# Patient Record
Sex: Female | Born: 1960
Health system: Southern US, Community
[De-identification: ages and names within clinical notes are randomized; demographics above are authoritative.]

## PROBLEM LIST (undated history)

## (undated) DIAGNOSIS — I8393 Asymptomatic varicose veins of bilateral lower extremities: Secondary | ICD-10-CM

## (undated) DIAGNOSIS — E079 Disorder of thyroid, unspecified: Secondary | ICD-10-CM

## (undated) DIAGNOSIS — I10 Essential (primary) hypertension: Secondary | ICD-10-CM

## (undated) DIAGNOSIS — I341 Nonrheumatic mitral (valve) prolapse: Secondary | ICD-10-CM

## (undated) DIAGNOSIS — K219 Gastro-esophageal reflux disease without esophagitis: Secondary | ICD-10-CM

## (undated) DIAGNOSIS — E785 Hyperlipidemia, unspecified: Secondary | ICD-10-CM

## (undated) DIAGNOSIS — Z85828 Personal history of other malignant neoplasm of skin: Secondary | ICD-10-CM

## (undated) DIAGNOSIS — E0580 Other thyrotoxicosis without thyrotoxic crisis or storm: Secondary | ICD-10-CM

## (undated) DIAGNOSIS — C801 Malignant (primary) neoplasm, unspecified: Secondary | ICD-10-CM

## (undated) DIAGNOSIS — R1310 Dysphagia, unspecified: Secondary | ICD-10-CM

## (undated) DIAGNOSIS — E063 Autoimmune thyroiditis: Secondary | ICD-10-CM

## (undated) DIAGNOSIS — L409 Psoriasis, unspecified: Secondary | ICD-10-CM

## (undated) DIAGNOSIS — I471 Supraventricular tachycardia, unspecified: Secondary | ICD-10-CM

## (undated) DIAGNOSIS — I499 Cardiac arrhythmia, unspecified: Secondary | ICD-10-CM

## (undated) DIAGNOSIS — I871 Compression of vein: Secondary | ICD-10-CM

## (undated) DIAGNOSIS — G43909 Migraine, unspecified, not intractable, without status migrainosus: Secondary | ICD-10-CM

## (undated) DIAGNOSIS — E039 Hypothyroidism, unspecified: Secondary | ICD-10-CM

## (undated) DIAGNOSIS — G43C0 Periodic headache syndromes in child or adult, not intractable: Secondary | ICD-10-CM

## (undated) DIAGNOSIS — R1319 Other dysphagia: Secondary | ICD-10-CM

## (undated) HISTORY — DX: Other dysphagia: R13.19

## (undated) HISTORY — PX: REFRACTIVE SURGERY: SHX103

## (undated) HISTORY — DX: Personal history of other malignant neoplasm of skin: Z85.828

## (undated) HISTORY — DX: Periodic headache syndromes in child or adult, not intractable: G43.C0

## (undated) HISTORY — PX: IR SCLEROTHERAPY OF A FLUID COLLECTION: IMG6090

## (undated) HISTORY — DX: Disorder of thyroid, unspecified: E07.9

## (undated) HISTORY — DX: Other thyrotoxicosis without thyrotoxic crisis or storm: E05.80

## (undated) HISTORY — DX: Psoriasis, unspecified: L40.9

## (undated) HISTORY — DX: Cardiac arrhythmia, unspecified: I49.9

## (undated) HISTORY — DX: Autoimmune thyroiditis: E06.3

## (undated) HISTORY — DX: Asymptomatic varicose veins of bilateral lower extremities: I83.93

## (undated) HISTORY — DX: Migraine, unspecified, not intractable, without status migrainosus: G43.909

## (undated) HISTORY — DX: Essential (primary) hypertension: I10

## (undated) HISTORY — DX: Nonrheumatic mitral (valve) prolapse: I34.1

---

## 1997-10-06 HISTORY — PX: TOTAL ABDOMINAL HYSTERECTOMY: SHX209

## 2000-10-06 HISTORY — PX: MELANOMA EXCISION: SHX5266

## 2004-09-25 ENCOUNTER — Ambulatory Visit: Payer: Self-pay | Admitting: Internal Medicine

## 2005-02-26 ENCOUNTER — Ambulatory Visit: Payer: Self-pay | Admitting: Otolaryngology

## 2006-07-30 ENCOUNTER — Ambulatory Visit: Payer: Self-pay | Admitting: Obstetrics & Gynecology

## 2007-12-30 ENCOUNTER — Ambulatory Visit: Payer: Self-pay | Admitting: Obstetrics and Gynecology

## 2009-02-09 ENCOUNTER — Ambulatory Visit: Payer: Self-pay | Admitting: Internal Medicine

## 2009-05-16 ENCOUNTER — Ambulatory Visit: Payer: Self-pay | Admitting: Obstetrics and Gynecology

## 2010-05-31 ENCOUNTER — Other Ambulatory Visit: Payer: Self-pay | Admitting: Internal Medicine

## 2010-06-21 ENCOUNTER — Ambulatory Visit: Payer: Self-pay | Admitting: Unknown Physician Specialty

## 2010-12-09 ENCOUNTER — Ambulatory Visit: Payer: Self-pay

## 2011-11-18 ENCOUNTER — Other Ambulatory Visit: Payer: Self-pay | Admitting: Internal Medicine

## 2011-11-18 LAB — TSH: Thyroid Stimulating Horm: 0.456 u[IU]/mL

## 2011-11-18 LAB — LIPASE, BLOOD: Lipase: 101 U/L (ref 73–393)

## 2012-08-02 ENCOUNTER — Other Ambulatory Visit: Payer: Self-pay | Admitting: Internal Medicine

## 2012-08-02 LAB — URINALYSIS, COMPLETE
Bilirubin,UR: NEGATIVE
Blood: NEGATIVE
Ketone: NEGATIVE
Ph: 8 (ref 4.5–8.0)
Specific Gravity: 1.001 (ref 1.003–1.030)
Squamous Epithelial: NONE SEEN

## 2012-08-02 LAB — BASIC METABOLIC PANEL
BUN: 12 mg/dL (ref 7–18)
Calcium, Total: 8.9 mg/dL (ref 8.5–10.1)
Chloride: 102 mmol/L (ref 98–107)
Creatinine: 0.92 mg/dL (ref 0.60–1.30)
EGFR (Non-African Amer.): >60
Glucose: 84 mg/dL (ref 65–99)
Osmolality: 280 (ref 275–301)
Potassium: 4.1 mmol/L (ref 3.5–5.1)

## 2012-08-02 LAB — LIPID PANEL
Cholesterol: 215 mg/dL — ABNORMAL HIGH (ref 0–200)
HDL Cholesterol: 80 mg/dL — ABNORMAL HIGH (ref 40–60)
Ldl Cholesterol, Calc: 123 mg/dL — ABNORMAL HIGH (ref 0–100)
Triglycerides: 61 mg/dL (ref 0–200)
VLDL Cholesterol, Calc: 12 mg/dL (ref 5–40)

## 2012-09-22 ENCOUNTER — Other Ambulatory Visit: Payer: Self-pay | Admitting: Internal Medicine

## 2013-02-09 ENCOUNTER — Other Ambulatory Visit: Payer: Self-pay | Admitting: Internal Medicine

## 2013-02-09 LAB — LIPID PANEL
Cholesterol: 224 mg/dL — ABNORMAL HIGH (ref 0–200)
HDL Cholesterol: 89 mg/dL — ABNORMAL HIGH (ref 40–60)
Ldl Cholesterol, Calc: 126 mg/dL — ABNORMAL HIGH (ref 0–100)
Triglycerides: 43 mg/dL (ref 0–200)
VLDL Cholesterol, Calc: 9 mg/dL (ref 5–40)

## 2013-02-09 LAB — CBC WITH DIFFERENTIAL/PLATELET
Basophil #: 0.1 10*3/uL (ref 0.0–0.1)
Basophil %: 1 %
Eosinophil #: 0.5 10*3/uL (ref 0.0–0.7)
Eosinophil %: 9.3 %
HCT: 37.6 % (ref 35.0–47.0)
HGB: 12.6 g/dL (ref 12.0–16.0)
Lymphocyte #: 1.4 10*3/uL (ref 1.0–3.6)
Lymphocyte %: 26.4 %
MCH: 29.3 pg (ref 26.0–34.0)
MCHC: 33.5 g/dL (ref 32.0–36.0)
MCV: 87 fL (ref 80–100)
Monocyte #: 0.5 x10 3/mm (ref 0.2–0.9)
Neutrophil #: 3 10*3/uL (ref 1.4–6.5)
Neutrophil %: 55 %
Platelet: 224 10*3/uL (ref 150–440)
RBC: 4.31 10*6/uL (ref 3.80–5.20)
WBC: 5.5 10*3/uL (ref 3.6–11.0)

## 2013-02-09 LAB — BASIC METABOLIC PANEL
Anion Gap: 5 — ABNORMAL LOW (ref 7–16)
Co2: 31 mmol/L (ref 21–32)
Creatinine: 0.9 mg/dL (ref 0.60–1.30)
EGFR (African American): >60
Osmolality: 273 (ref 275–301)
Potassium: 3.7 mmol/L (ref 3.5–5.1)
Sodium: 136 mmol/L (ref 136–145)

## 2013-02-09 LAB — URINALYSIS, COMPLETE
Ketone: NEGATIVE
Ph: 7 (ref 4.5–8.0)
Protein: NEGATIVE
RBC,UR: 4 /HPF (ref 0–5)
Specific Gravity: 1.012 (ref 1.003–1.030)
WBC UR: 14 /HPF (ref 0–5)

## 2013-09-23 ENCOUNTER — Ambulatory Visit: Payer: Self-pay | Admitting: Unknown Physician Specialty

## 2013-09-26 LAB — PATHOLOGY REPORT

## 2014-01-24 ENCOUNTER — Ambulatory Visit: Payer: Self-pay

## 2014-02-24 ENCOUNTER — Ambulatory Visit: Payer: Self-pay | Admitting: Unknown Physician Specialty

## 2015-01-12 ENCOUNTER — Encounter: Payer: Self-pay | Admitting: *Deleted

## 2015-02-16 ENCOUNTER — Other Ambulatory Visit
Admission: RE | Admit: 2015-02-16 | Discharge: 2015-02-16 | Disposition: A | Discharge status: Home / Self Care | Payer: 59 | Source: Ambulatory Visit | Attending: General Practice | Admitting: General Practice

## 2015-02-16 DIAGNOSIS — Z048 Encounter for examination and observation for other specified reasons: Secondary | ICD-10-CM | POA: Insufficient documentation

## 2015-02-16 DIAGNOSIS — Z779 Other contact with and (suspected) exposures hazardous to health: Secondary | ICD-10-CM | POA: Insufficient documentation

## 2015-02-16 LAB — POTASSIUM: Potassium: 3.8 mmol/L (ref 3.5–5.1)

## 2015-02-16 LAB — TSH: TSH: 3.822 u[IU]/mL (ref 0.350–4.500)

## 2015-02-16 LAB — CREATININE, SERUM: Creatinine, Ser: 0.91 mg/dL (ref 0.44–1.00)

## 2015-02-16 LAB — BUN: BUN: 18 mg/dL (ref 6–20)

## 2015-02-16 LAB — ALT: ALT: 24 U/L (ref 14–54)

## 2015-09-10 DIAGNOSIS — E785 Hyperlipidemia, unspecified: Secondary | ICD-10-CM | POA: Insufficient documentation

## 2015-09-10 DIAGNOSIS — E063 Autoimmune thyroiditis: Secondary | ICD-10-CM | POA: Insufficient documentation

## 2015-09-10 DIAGNOSIS — L409 Psoriasis, unspecified: Secondary | ICD-10-CM | POA: Insufficient documentation

## 2015-09-10 DIAGNOSIS — E038 Other specified hypothyroidism: Secondary | ICD-10-CM | POA: Insufficient documentation

## 2015-09-10 DIAGNOSIS — I10 Essential (primary) hypertension: Secondary | ICD-10-CM | POA: Insufficient documentation

## 2015-11-29 DIAGNOSIS — D225 Melanocytic nevi of trunk: Secondary | ICD-10-CM | POA: Diagnosis not present

## 2015-11-29 DIAGNOSIS — D2262 Melanocytic nevi of left upper limb, including shoulder: Secondary | ICD-10-CM | POA: Diagnosis not present

## 2015-11-29 DIAGNOSIS — Z85820 Personal history of malignant melanoma of skin: Secondary | ICD-10-CM | POA: Diagnosis not present

## 2015-11-29 DIAGNOSIS — D2271 Melanocytic nevi of right lower limb, including hip: Secondary | ICD-10-CM | POA: Diagnosis not present

## 2015-12-12 DIAGNOSIS — H5213 Myopia, bilateral: Secondary | ICD-10-CM | POA: Diagnosis not present

## 2016-05-02 DIAGNOSIS — H5213 Myopia, bilateral: Secondary | ICD-10-CM | POA: Diagnosis not present

## 2016-05-02 DIAGNOSIS — H1852 Epithelial (juvenile) corneal dystrophy: Secondary | ICD-10-CM | POA: Diagnosis not present

## 2016-06-30 DIAGNOSIS — Z08 Encounter for follow-up examination after completed treatment for malignant neoplasm: Secondary | ICD-10-CM | POA: Diagnosis not present

## 2016-06-30 DIAGNOSIS — Z85820 Personal history of malignant melanoma of skin: Secondary | ICD-10-CM | POA: Diagnosis not present

## 2016-06-30 DIAGNOSIS — D225 Melanocytic nevi of trunk: Secondary | ICD-10-CM | POA: Diagnosis not present

## 2016-10-24 ENCOUNTER — Ambulatory Visit (INDEPENDENT_AMBULATORY_CARE_PROVIDER_SITE_OTHER): Payer: 59 | Admitting: Physician Assistant

## 2016-10-24 ENCOUNTER — Encounter: Payer: Self-pay | Admitting: Physician Assistant

## 2016-10-24 VITALS — BP 135/85 | HR 58 | Resp 18 | Ht 68.0 in | Wt 142.0 lb

## 2016-10-24 DIAGNOSIS — G43009 Migraine without aura, not intractable, without status migrainosus: Secondary | ICD-10-CM

## 2016-10-24 DIAGNOSIS — M62838 Other muscle spasm: Secondary | ICD-10-CM

## 2016-10-24 DIAGNOSIS — I341 Nonrheumatic mitral (valve) prolapse: Secondary | ICD-10-CM | POA: Insufficient documentation

## 2016-10-24 DIAGNOSIS — Z85828 Personal history of other malignant neoplasm of skin: Secondary | ICD-10-CM | POA: Insufficient documentation

## 2016-10-24 DIAGNOSIS — G43709 Chronic migraine without aura, not intractable, without status migrainosus: Secondary | ICD-10-CM | POA: Diagnosis not present

## 2016-10-24 MED ORDER — PROMETHAZINE HCL 25 MG PO TABS: 25.0000 mg | ORAL_TABLET | Freq: Four times a day (QID) | ORAL | 0 refills | Status: AC | PRN

## 2016-10-24 MED ORDER — CYCLOBENZAPRINE HCL 10 MG PO TABS: 10.0000 mg | ORAL_TABLET | Freq: Three times a day (TID) | ORAL | 1 refills | Status: DC | PRN

## 2016-10-24 NOTE — Progress Notes (Signed)
History:  Sara Moss is a 56 y.o. G1P1001 who presents to clinic today for a new headache visit.   Her chiropractor told her she may benefit from Botox.  She has had migraines since age 32.  Initially they were 2x per year.  Now she is having them much more often.  She has almost daily HA but the do not all turn into migraines.  They can be severe, located at the base of the head and the right eye.  There is throbbing, movement makes it worse.  There is nausea, sensitivity to light and noise.  There is nausea and vomiting.  There is no aura.  It often wakes her up during the night.  It lasts several days.   She uses Axert for acute migraine.  She gets that from Dr. Kandace Blitz.  She uses BC Powders.  Advil and Aleve have not been useful.  She has not used any other triptan.  She has not used prevention for migraine but is on lisinopril.   She has episodes of palpitations or feeling her heart rate increasing and plans to seek cardiology referral again as it has been years since last seen for this.    HIT6:66 Number of days in the last 4 weeks with:  Severe headache: 6 Moderate headache: 14 Mild headache: 5  No headache: 3   Past Medical History:  Diagnosis Date  . Hypertension   . Migraines   . Mitral valve prolapse   . Psoriasis   . Thyroid disease    Hypothyroid    Social History   Social History  . Marital status: Married    Spouse name: N/A  . Number of children: N/A  . Years of education: N/A   Occupational History  . Not on file.   Social History Main Topics  . Smoking status: Never Smoker  . Smokeless tobacco: Never Used  . Alcohol use Yes     Comment: rare  . Drug use: No  . Sexual activity: Yes    Birth control/ protection: Surgical   Other Topics Concern  . Not on file   Social History Narrative  . No narrative on file    Family History  Problem Relation Age of Onset  . Cancer Father     esophageal  . Cancer Mother     cervical    No Known  Allergies  No current outpatient prescriptions on file prior to visit.   No current facility-administered medications on file prior to visit.      Review of Systems:  All pertinent positive/negative included in HPI, all other review of systems are negative   Objective:  Physical Exam BP 135/85 (BP Location: Left Arm, Patient Position: Sitting, Cuff Size: Normal)   Pulse (!) 58   Resp 18   Ht 5\' 8"  (1.727 m)   Wt 142 lb (64.4 kg)   BMI 21.59 kg/m  CONSTITUTIONAL: Well-developed, well-nourished female in no acute distress.  EYES: EOM intact ENT: Normocephalic CARDIOVASCULAR: Regular rate RESPIRATORY: Normal rate.  MUSCULOSKELETAL: Normal ROM, strength equal bilaterally, bilat muscle spasm noted SKIN: Warm, dry without erythema  NEUROLOGICAL: Alert, oriented, CN II-XII grossly intact, Appropriate balance PSYCH: Normal behavior, mood   Assessment & Plan:  Assessment: 1. Muscle spasm   2. Migraine without aura and without status migrainosus, not intractable   3. Chronic migraine without aura without status migrainosus, not intractable   4. MVP (mitral valve prolapse)      Plan: Patient interested in  potential migraine prevention drug study.  Will inquire on her behalf.   She is also interested in Botox.  This is a lovely option as it will not interfere with other medications however it may be too costly.  I will inquire about a prior authorization for her.  After determining eligibility and cost, she will be better equipped to decide.   She has not used preventive medications previously.  Could consider TCA or Topiramate.   See cardiology for palpitations.   Continue Axert for now.   Flexeril for muscle spasm Phenergan for vomiting/HA rescue. Follow-up in 6 weeks or sooner PRN  Paticia Stack, PA-C 10/24/2016 10:11 AM

## 2016-10-24 NOTE — Patient Instructions (Signed)
Migraine Headache A migraine headache is an intense, throbbing pain on one side or both sides of the head. Migraines may also cause other symptoms, such as nausea, vomiting, and sensitivity to light and noise. What are the causes? Doing or taking certain things may also trigger migraines, such as:  Alcohol.  Smoking.  Medicines, such as: ? Medicine used to treat chest pain (nitroglycerine). ? Birth control pills. ? Estrogen pills. ? Certain blood pressure medicines.  Aged cheeses, chocolate, or caffeine.  Foods or drinks that contain nitrates, glutamate, aspartame, or tyramine.  Physical activity.  Other things that may trigger a migraine include:  Menstruation.  Pregnancy.  Hunger.  Stress, lack of sleep, too much sleep, or fatigue.  Weather changes.  What increases the risk? The following factors may make you more likely to experience migraine headaches:  Age. Risk increases with age.  Family history of migraine headaches.  Being Caucasian.  Depression and anxiety.  Obesity.  Being a woman.  Having a hole in the heart (patent foramen ovale) or other heart problems.  What are the signs or symptoms? The main symptom of this condition is pulsating or throbbing pain. Pain may:  Happen in any area of the head, such as on one side or both sides.  Interfere with daily activities.  Get worse with physical activity.  Get worse with exposure to bright lights or loud noises.  Other symptoms may include:  Nausea.  Vomiting.  Dizziness.  General sensitivity to bright lights, loud noises, or smells.  Before you get a migraine, you may get warning signs that a migraine is developing (aura). An aura may include:  Seeing flashing lights or having blind spots.  Seeing bright spots, halos, or zigzag lines.  Having tunnel vision or blurred vision.  Having numbness or a tingling feeling.  Having trouble talking.  Having muscle weakness.  How is this  diagnosed? A migraine headache can be diagnosed based on:  Your symptoms.  A physical exam.  Tests, such as CT scan or MRI of the head. These imaging tests can help rule out other causes of headaches.  Taking fluid from the spine (lumbar puncture) and analyzing it (cerebrospinal fluid analysis, or CSF analysis).  How is this treated? A migraine headache is usually treated with medicines that:  Relieve pain.  Relieve nausea.  Prevent migraines from coming back.  Treatment may also include:  Acupuncture.  Lifestyle changes like avoiding foods that trigger migraines.  Follow these instructions at home: Medicines  Take over-the-counter and prescription medicines only as told by your health care provider.  Do not drive or use heavy machinery while taking prescription pain medicine.  To prevent or treat constipation while you are taking prescription pain medicine, your health care provider may recommend that you: ? Drink enough fluid to keep your urine clear or pale yellow. ? Take over-the-counter or prescription medicines. ? Eat foods that are high in fiber, such as fresh fruits and vegetables, whole grains, and beans. ? Limit foods that are high in fat and processed sugars, such as fried and sweet foods. Lifestyle  Avoid alcohol use.  Do not use any products that contain nicotine or tobacco, such as cigarettes and e-cigarettes. If you need help quitting, ask your health care provider.  Get at least 8 hours of sleep every night.  Limit your stress. General instructions   Keep a journal to find out what may trigger your migraine headaches. For example, write down: ? What you eat and   drink. ? How much sleep you get. ? Any change to your diet or medicines.  If you have a migraine: ? Avoid things that make your symptoms worse, such as bright lights. ? It may help to lie down in a dark, quiet room. ? Do not drive or use heavy machinery. ? Ask your health care provider  what activities are safe for you while you are experiencing symptoms.  Keep all follow-up visits as told by your health care provider. This is important. Contact a health care provider if:  You develop symptoms that are different or more severe than your usual migraine symptoms. Get help right away if:  Your migraine becomes severe.  You have a fever.  You have a stiff neck.  You have vision loss.  Your muscles feel weak or like you cannot control them.  You start to lose your balance often.  You develop trouble walking.  You faint. This information is not intended to replace advice given to you by your health care provider. Make sure you discuss any questions you have with your health care provider. Document Released: 09/22/2005 Document Revised: 04/11/2016 Document Reviewed: 03/10/2016 Elsevier Interactive Patient Education  2017 [employer]   

## 2016-11-12 ENCOUNTER — Other Ambulatory Visit: Payer: Self-pay | Admitting: Internal Medicine

## 2016-11-12 DIAGNOSIS — L409 Psoriasis, unspecified: Secondary | ICD-10-CM | POA: Diagnosis not present

## 2016-11-12 DIAGNOSIS — E038 Other specified hypothyroidism: Secondary | ICD-10-CM | POA: Diagnosis not present

## 2016-11-12 DIAGNOSIS — Z1159 Encounter for screening for other viral diseases: Secondary | ICD-10-CM | POA: Diagnosis not present

## 2016-11-12 DIAGNOSIS — Z0001 Encounter for general adult medical examination with abnormal findings: Secondary | ICD-10-CM | POA: Diagnosis not present

## 2016-11-12 DIAGNOSIS — I471 Supraventricular tachycardia: Secondary | ICD-10-CM | POA: Diagnosis not present

## 2016-11-12 DIAGNOSIS — I10 Essential (primary) hypertension: Secondary | ICD-10-CM | POA: Diagnosis not present

## 2016-11-12 DIAGNOSIS — Z1231 Encounter for screening mammogram for malignant neoplasm of breast: Secondary | ICD-10-CM

## 2016-11-19 ENCOUNTER — Other Ambulatory Visit: Payer: Self-pay | Admitting: Internal Medicine

## 2016-11-19 DIAGNOSIS — R011 Cardiac murmur, unspecified: Secondary | ICD-10-CM

## 2016-11-24 ENCOUNTER — Ambulatory Visit: Payer: 59

## 2016-11-25 ENCOUNTER — Ambulatory Visit
Admission: RE | Admit: 2016-11-25 | Discharge: 2016-11-25 | Disposition: A | Discharge status: Home / Self Care | Payer: 59 | Source: Ambulatory Visit | Attending: Internal Medicine | Admitting: Internal Medicine

## 2016-11-25 DIAGNOSIS — G43909 Migraine, unspecified, not intractable, without status migrainosus: Secondary | ICD-10-CM | POA: Diagnosis not present

## 2016-11-25 DIAGNOSIS — I340 Nonrheumatic mitral (valve) insufficiency: Secondary | ICD-10-CM | POA: Diagnosis not present

## 2016-11-25 DIAGNOSIS — I341 Nonrheumatic mitral (valve) prolapse: Secondary | ICD-10-CM | POA: Diagnosis not present

## 2016-11-25 DIAGNOSIS — R011 Cardiac murmur, unspecified: Secondary | ICD-10-CM | POA: Diagnosis not present

## 2016-11-25 DIAGNOSIS — E079 Disorder of thyroid, unspecified: Secondary | ICD-10-CM | POA: Diagnosis not present

## 2016-11-25 DIAGNOSIS — I10 Essential (primary) hypertension: Secondary | ICD-10-CM | POA: Insufficient documentation

## 2016-11-25 NOTE — Progress Notes (Signed)
*  PRELIMINARY RESULTS* Echocardiogram 2D Echocardiogram has been performed.  Sara Moss 11/25/2016, 10:51 AM

## 2016-12-05 ENCOUNTER — Encounter: Payer: Self-pay | Admitting: Physician Assistant

## 2016-12-05 ENCOUNTER — Ambulatory Visit (INDEPENDENT_AMBULATORY_CARE_PROVIDER_SITE_OTHER): Payer: 59 | Admitting: Physician Assistant

## 2016-12-05 VITALS — BP 137/73 | HR 78 | Resp 18 | Ht 68.0 in | Wt 143.0 lb

## 2016-12-05 DIAGNOSIS — G43709 Chronic migraine without aura, not intractable, without status migrainosus: Secondary | ICD-10-CM | POA: Diagnosis not present

## 2016-12-05 DIAGNOSIS — G43009 Migraine without aura, not intractable, without status migrainosus: Secondary | ICD-10-CM

## 2016-12-05 DIAGNOSIS — M62838 Other muscle spasm: Secondary | ICD-10-CM | POA: Insufficient documentation

## 2016-12-05 DIAGNOSIS — IMO0002 Reserved for concepts with insufficient information to code with codable children: Secondary | ICD-10-CM | POA: Insufficient documentation

## 2016-12-05 MED ORDER — SUMATRIPTAN SUCCINATE 100 MG PO TABS: 100.0000 mg | ORAL_TABLET | Freq: Once | ORAL | 2 refills | Status: AC | PRN

## 2016-12-05 MED ORDER — CYCLOBENZAPRINE HCL 10 MG PO TABS: 10.0000 mg | ORAL_TABLET | Freq: Three times a day (TID) | ORAL | 1 refills | Status: DC | PRN

## 2016-12-05 NOTE — Patient Instructions (Signed)

## 2016-12-05 NOTE — Progress Notes (Signed)
History:  Sara Moss is a 56 y.o. G1P1001 who presents to clinic today for HA f/u.  She is now taking flexeril 5mg  most every night and it has been helpful.  She is waking with fewer migraines.  The Axert has continued to work but is costing $50 for 9 tablets - generic.  She would like to consider another option. She had an ECHO and no problems were identified.  She confirmed that she can se a triptan with her MVP.  She is having fewer palpitations but also has a new rx for metoprolol to use PRN.  She has not yet tried this.   She is advised that beta blockers can provide good migraine prevention.  She is not interested in using any daily medications for migraine prevention at this itme.    HIT6:54 Number of days in the last 4 weeks with:  Severe headache: 3 Moderate headache: 12 Mild headache: 7  No headache: 6   Past Medical History:  Diagnosis Date  . Hypertension   . Migraines   . Mitral valve prolapse   . Psoriasis   . Thyroid disease    Hypothyroid    Social History   Social History  . Marital status: Married    Spouse name: N/A  . Number of children: N/A  . Years of education: N/A   Occupational History  . Not on file.   Social History Main Topics  . Smoking status: Never Smoker  . Smokeless tobacco: Never Used  . Alcohol use Yes     Comment: rare  . Drug use: No  . Sexual activity: Yes    Birth control/ protection: Surgical   Other Topics Concern  . Not on file   Social History Narrative  . No narrative on file    Family History  Problem Relation Age of Onset  . Cancer Father     esophageal  . Cancer Mother     cervical    No Known Allergies  Current Outpatient Prescriptions on File Prior to Visit  Medication Sig Dispense Refill  . almotriptan (AXERT) 12.5 MG tablet Take 12.5 mg by mouth as needed.    . ALPRAZolam (XANAX) 0.25 MG tablet Take 0.25 mg by mouth as needed.    . cyclobenzaprine (FLEXERIL) 10 MG tablet Take 1 tablet (10 mg total) by  mouth every 8 (eight) hours as needed for muscle spasms. 30 tablet 1  . lisinopril (PRINIVIL,ZESTRIL) 10 MG tablet Take 10 mg by mouth daily.  3  . promethazine (PHENERGAN) 25 MG tablet Take 1 tablet (25 mg total) by mouth every 6 (six) hours as needed for nausea or vomiting. 30 tablet 0  . SYNTHROID 100 MCG tablet Take 100 mcg by mouth daily.  3   No current facility-administered medications on file prior to visit.      Review of Systems:  All pertinent positive/negative included in HPI, all other review of systems are negative   Objective:  Physical Exam BP 137/73 (BP Location: Left Arm, Patient Position: Sitting, Cuff Size: Normal)   Pulse 78   Resp 18   Ht 5\' 8"  (1.727 m)   Wt 143 lb (64.9 kg)   BMI 21.74 kg/m  CONSTITUTIONAL: Well-developed, well-nourished female in no acute distress.  EYES: EOM intact ENT: Normocephalic CARDIOVASCULAR: Regular rate RESPIRATORY: Normal rate.   MUSCULOSKELETAL: Normal ROM SKIN: Warm, dry without erythema  NEUROLOGICAL: Alert, oriented, CN II-XII grossly intact, Appropriate balance PSYCH: Normal behavior, mood   Assessment &  Plan:  Assessment: 1. Migraine without aura and without status migrainosus, not intractable   2. Muscle spasm   3. Chronic migraine      Plan: Pt declines prevention with beta blocker/topamax/etc.   Continue flexeril as previous Replace Axert with Imitrex.  She may use 1/2 tab if desired.   Follow-up in 3 months or sooner PRN Will submit for botox prior auth as this has not yet been done and pt may be interested.  Paticia Stack, PA-C 12/05/2016 9:46 AM

## 2016-12-10 ENCOUNTER — Ambulatory Visit: Payer: 59

## 2016-12-10 DIAGNOSIS — E038 Other specified hypothyroidism: Secondary | ICD-10-CM | POA: Diagnosis not present

## 2016-12-10 DIAGNOSIS — I10 Essential (primary) hypertension: Secondary | ICD-10-CM | POA: Diagnosis not present

## 2016-12-10 DIAGNOSIS — E784 Other hyperlipidemia: Secondary | ICD-10-CM | POA: Diagnosis not present

## 2016-12-10 DIAGNOSIS — I471 Supraventricular tachycardia: Secondary | ICD-10-CM | POA: Diagnosis not present

## 2017-02-02 DIAGNOSIS — Z85820 Personal history of malignant melanoma of skin: Secondary | ICD-10-CM | POA: Diagnosis not present

## 2017-02-02 DIAGNOSIS — D2272 Melanocytic nevi of left lower limb, including hip: Secondary | ICD-10-CM | POA: Diagnosis not present

## 2017-02-02 DIAGNOSIS — D225 Melanocytic nevi of trunk: Secondary | ICD-10-CM | POA: Diagnosis not present

## 2017-02-02 DIAGNOSIS — D2261 Melanocytic nevi of right upper limb, including shoulder: Secondary | ICD-10-CM | POA: Diagnosis not present

## 2017-03-11 ENCOUNTER — Encounter: Payer: Self-pay | Admitting: Obstetrics and Gynecology

## 2017-03-11 ENCOUNTER — Ambulatory Visit (INDEPENDENT_AMBULATORY_CARE_PROVIDER_SITE_OTHER): Payer: 59 | Admitting: Obstetrics and Gynecology

## 2017-03-11 VITALS — BP 128/87 | HR 61 | Ht 68.0 in | Wt 143.9 lb

## 2017-03-11 DIAGNOSIS — N952 Postmenopausal atrophic vaginitis: Secondary | ICD-10-CM

## 2017-03-11 DIAGNOSIS — N941 Unspecified dyspareunia: Secondary | ICD-10-CM | POA: Diagnosis not present

## 2017-03-11 DIAGNOSIS — Z1231 Encounter for screening mammogram for malignant neoplasm of breast: Secondary | ICD-10-CM | POA: Diagnosis not present

## 2017-03-11 DIAGNOSIS — Z90722 Acquired absence of ovaries, bilateral: Secondary | ICD-10-CM | POA: Diagnosis not present

## 2017-03-11 DIAGNOSIS — Z9079 Acquired absence of other genital organ(s): Secondary | ICD-10-CM

## 2017-03-11 DIAGNOSIS — Z1239 Encounter for other screening for malignant neoplasm of breast: Secondary | ICD-10-CM

## 2017-03-11 DIAGNOSIS — Z01419 Encounter for gynecological examination (general) (routine) without abnormal findings: Secondary | ICD-10-CM | POA: Diagnosis not present

## 2017-03-11 DIAGNOSIS — Z90710 Acquired absence of both cervix and uterus: Secondary | ICD-10-CM

## 2017-03-11 DIAGNOSIS — Z1211 Encounter for screening for malignant neoplasm of colon: Secondary | ICD-10-CM

## 2017-03-11 MED ORDER — PRASTERONE 6.5 MG VA INST: 6.5000 mg | VAGINAL_INSERT | Freq: Every day | VAGINAL | 6 refills | Status: DC

## 2017-03-11 NOTE — Progress Notes (Signed)
ANNUAL PREVENTATIVE CARE GYN  ENCOUNTER NOTE  Subjective:       Sara Moss is a 56 y.o. G3P1001 female here for a routine annual gynecologic exam.  Current complaints: 1.  Painful IC   No major interval health issues. Ongoing problem of painful intercourse persists. Over-the-counter suppositories/lubricants are not very helpful.   Gynecologic History No LMP recorded. Patient has had a hysterectomy. Status post TAH/BSO Contraception: status post hysterectomy Last Pap: unsure. Results were: normal Last mammogram: 2015 birad 1. Results were: normal  Obstetric History OB History  Gravida Para Term Preterm AB Living  1 1 1     1   SAB TAB Ectopic Multiple Live Births          1    # Outcome Date GA Lbr Len/2nd Weight Sex Delivery Anes PTL Lv  1 Term 12/17/82 [redacted]w[redacted]d  7 lb 9 oz (3.43 kg) F Vag-Spont   LIV      Past Medical History:  Diagnosis Date  . Hypertension   . Migraines   . Mitral valve prolapse   . Psoriasis   . Thyroid disease    Hypothyroid    Past Surgical History:  Procedure Laterality Date  . MELANOMA EXCISION  2002  . TOTAL ABDOMINAL HYSTERECTOMY  1999    Current Outpatient Prescriptions on File Prior to Visit  Medication Sig Dispense Refill  . almotriptan (AXERT) 12.5 MG tablet Take 12.5 mg by mouth as needed.    . ALPRAZolam (XANAX) 0.25 MG tablet Take 0.25 mg by mouth as needed.    . cyclobenzaprine (FLEXERIL) 10 MG tablet Take 1 tablet (10 mg total) by mouth every 8 (eight) hours as needed for muscle spasms. 30 tablet 1  . lisinopril (PRINIVIL,ZESTRIL) 10 MG tablet Take 10 mg by mouth daily.  3  . promethazine (PHENERGAN) 25 MG tablet Take 1 tablet (25 mg total) by mouth every 6 (six) hours as needed for nausea or vomiting. 30 tablet 0  . SUMAtriptan (IMITREX) 100 MG tablet Take 1 tablet (100 mg total) by mouth once as needed for migraine. May repeat in 2 hours if headache persists or recurs. 9 tablet 2  . SYNTHROID 100 MCG tablet Take 100 mcg by mouth  daily.  3   No current facility-administered medications on file prior to visit.     No Known Allergies  Social History   Social History  . Marital status: Married    Spouse name: N/A  . Number of children: N/A  . Years of education: N/A   Occupational History  . Not on file.   Social History Main Topics  . Smoking status: Never Smoker  . Smokeless tobacco: Never Used  . Alcohol use Yes     Comment: rare  . Drug use: No  . Sexual activity: Yes    Birth control/ protection: Surgical   Other Topics Concern  . Not on file   Social History Narrative  . No narrative on file    Family History  Problem Relation Age of Onset  . Cancer Father        esophageal  . Cancer Mother        cervical    The following portions of the patient's history were reviewed and updated as appropriate: allergies, current medications, past family history, past medical history, past social history, past surgical history and problem list.  Review of Systems Review of Systems  Constitutional: Negative.        Occasional vasomotor symptom  HENT: Negative.   Eyes: Negative.   Respiratory: Negative.   Cardiovascular: Negative.   Gastrointestinal: Negative.   Genitourinary: Negative.        Dyspareunia persists  Musculoskeletal: Negative.   Skin: Negative.   Neurological: Negative.   Endo/Heme/Allergies: Negative.   Psychiatric/Behavioral: Negative.       Objective:   BP 128/87   Pulse 61   Ht 5\' 8"  (1.727 m)   Wt 143 lb 14.4 oz (65.3 kg)   BMI 21.88 kg/m  CONSTITUTIONAL: Well-developed, well-nourished female in no acute distress.  PSYCHIATRIC: Normal mood and affect. Normal behavior. Normal judgment and thought content. [place]: Alert and oriented to person, place, and time. Normal muscle tone coordination. No cranial nerve deficit noted. HENT:  Normocephalic, atraumatic, External right and left ear normal. Oropharynx is clear and moist EYES: Conjunctivae and EOM are  normal.No scleral icterus.  NECK: Normal range of motion, supple, no masses.  Normal thyroid.  SKIN: Skin is warm and dry. No rash noted. Not diaphoretic. No erythema. No pallor. CARDIOVASCULAR: Normal heart rate noted, regular rhythm, 2/6 systolic ejection murmur RESPIRATORY: Clear to auscultation bilaterally. Effort and breath sounds normal, no problems with respiration noted. BREASTS: Symmetric in size. No masses, skin changes, nipple drainage, or lymphadenopathy. ABDOMEN: Soft, normal bowel sounds, no distention noted.  No tenderness, rebound or guarding.  BLADDER: Normal PELVIC:  External Genitalia: Normal  BUS: Normal  Vagina: Moderate atrophy; good vault support  Cervix: Surgically absent  Uterus: Surgically absent  Adnexa: Normal  RV: External Exam NormaI, No Rectal Masses and Normal Sphincter tone  MUSCULOSKELETAL: Normal range of motion. No tenderness.  No cyanosis, clubbing, or edema.  2+ distal pulses. LYMPHATIC: No Axillary, Supraclavicular, or Inguinal Adenopathy.    Assessment:   Annual gynecologic examination 56 y.o. Contraception: status post hysterectomy Normal BMI Vaginal atrophy, symptomatic History  Plan:  Pap: Not needed Mammogram: Ordered Stool Guaiac Testing:  Ordered Labs: thru pcp Routine preventative health maintenance measures emphasized: Exercise/Diet/Weight control, Tobacco Warnings and Alcohol/Substance use risks Trial of [place] vaginal suppositories nightly Lubricants discussed Return to [place], [place]' Brayton Mars, MD  Note: This dictation was prepared with Dragon dictation along with smaller phrase technology. Any transcriptional errors that result from this process are unintentional.

## 2017-03-11 NOTE — Patient Instructions (Signed)
1. No Pap smear needed 2. Mammogram ordered 3. Stool guaiac cards are given for colon cancer screening 4. Continue with healthy eating and exercise 5. Continue with calcium and vitamin D supplementation daily 6. Lubricant alternatives:  Astroglide  JO H2O lubricant  Coconut oil  Virgin olive oil 7. Begin trial of Intrarosa vaginal suppositories nightly 8. Return in 1 year for annual exam   Health Maintenance for Postmenopausal Women Menopause is a normal process in which your reproductive ability comes to an end. This process happens gradually over a span of months to years, usually between the ages of 35 and 90. Menopause is complete when you have missed 12 consecutive menstrual periods. It is important to talk with your health care provider about some of the most common conditions that affect postmenopausal women, such as heart disease, cancer, and bone loss (osteoporosis). Adopting a healthy lifestyle and getting preventive care can help to promote your health and wellness. Those actions can also lower your chances of developing some of these common conditions. What should I know about menopause? During menopause, you may experience a number of symptoms, such as:  Moderate-to-severe hot flashes.  Night sweats.  Decrease in sex drive.  Mood swings.  Headaches.  Tiredness.  Irritability.  Memory problems.  Insomnia.  Choosing to treat or not to treat menopausal changes is an individual decision that you make with your health care provider. What should I know about hormone replacement therapy and supplements? Hormone therapy products are effective for treating symptoms that are associated with menopause, such as hot flashes and night sweats. Hormone replacement carries certain risks, especially as you become older. If you are thinking about using estrogen or estrogen with progestin treatments, discuss the benefits and risks with your health care provider. What should I know  about heart disease and stroke? Heart disease, heart attack, and stroke become more likely as you age. This may be due, in part, to the hormonal changes that your body experiences during menopause. These can affect how your body processes dietary fats, triglycerides, and cholesterol. Heart attack and stroke are both medical emergencies. There are many things that you can do to help prevent heart disease and stroke:  Have your blood pressure checked at least every 1-2 years. High blood pressure causes heart disease and increases the risk of stroke.  If you are 25-52 years old, ask your health care provider if you should take aspirin to prevent a heart attack or a stroke.  Do not use any tobacco products, including cigarettes, chewing tobacco, or electronic cigarettes. If you need help quitting, ask your health care provider.  It is important to eat a healthy diet and maintain a healthy weight. ? Be sure to include plenty of vegetables, fruits, low-fat dairy products, and lean protein. ? Avoid eating foods that are high in solid fats, added sugars, or salt (sodium).  Get regular exercise. This is one of the most important things that you can do for your health. ? Try to exercise for at least 150 minutes each week. The type of exercise that you do should increase your heart rate and make you sweat. This is known as moderate-intensity exercise. ? Try to do strengthening exercises at least twice each week. Do these in addition to the moderate-intensity exercise.  Know your numbers.Ask your health care provider to check your cholesterol and your blood glucose. Continue to have your blood tested as directed by your health care provider.  What should I know about cancer  screening? There are several types of cancer. Take the following steps to reduce your risk and to catch any cancer development as early as possible. Breast Cancer  Practice breast self-awareness. ? This means understanding how your  breasts normally appear and feel. ? It also means doing regular breast self-exams. Let your health care provider know about any changes, no matter how small.  If you are 47 or older, have a clinician do a breast exam (clinical breast exam or CBE) every year. Depending on your age, family history, and medical history, it may be recommended that you also have a yearly breast X-ray (mammogram).  If you have a family history of breast cancer, talk with your health care provider about genetic screening.  If you are at high risk for breast cancer, talk with your health care provider about having an MRI and a mammogram every year.  Breast cancer (BRCA) gene test is recommended for women who have family members with BRCA-related cancers. Results of the assessment will determine the need for genetic counseling and BRCA1 and for BRCA2 testing. BRCA-related cancers include these types: ? Breast. This occurs in males or females. ? Ovarian. ? Tubal. This may also be called fallopian tube cancer. ? Cancer of the abdominal or pelvic lining (peritoneal cancer). ? Prostate. ? Pancreatic.  Cervical, Uterine, and Ovarian Cancer Your health care provider may recommend that you be screened regularly for cancer of the pelvic organs. These include your ovaries, uterus, and vagina. This screening involves a pelvic exam, which includes checking for microscopic changes to the surface of your cervix (Pap test).  For women ages 21-65, health care providers may recommend a pelvic exam and a Pap test every three years. For women ages 19-65, they may recommend the Pap test and pelvic exam, combined with testing for human papilloma virus (HPV), every five years. Some types of HPV increase your risk of cervical cancer. Testing for HPV may also be done on women of any age who have unclear Pap test results.  Other health care providers may not recommend any screening for nonpregnant women who are considered low risk for pelvic  cancer and have no symptoms. Ask your health care provider if a screening pelvic exam is right for you.  If you have had past treatment for cervical cancer or a condition that could lead to cancer, you need Pap tests and screening for cancer for at least 20 years after your treatment. If Pap tests have been discontinued for you, your risk factors (such as having a new sexual partner) need to be reassessed to determine if you should start having screenings again. Some women have medical problems that increase the chance of getting cervical cancer. In these cases, your health care provider may recommend that you have screening and Pap tests more often.  If you have a family history of uterine cancer or ovarian cancer, talk with your health care provider about genetic screening.  If you have vaginal bleeding after reaching menopause, tell your health care provider.  There are currently no reliable tests available to screen for ovarian cancer.  Lung Cancer Lung cancer screening is recommended for adults 57-92 years old who are at high risk for lung cancer because of a history of smoking. A yearly low-dose CT scan of the lungs is recommended if you:  Currently smoke.  Have a history of at least 30 pack-years of smoking and you currently smoke or have quit within the past 15 years. A pack-year is smoking  an average of one pack of cigarettes per day for one year.  Yearly screening should:  Continue until it has been 15 years since you quit.  Stop if you develop a health problem that would prevent you from having lung cancer treatment.  Colorectal Cancer  This type of cancer can be detected and can often be prevented.  Routine colorectal cancer screening usually begins at age 61 and continues through age 38.  If you have risk factors for colon cancer, your health care provider may recommend that you be screened at an earlier age.  If you have a family history of colorectal cancer, talk with  your health care provider about genetic screening.  Your health care provider may also recommend using home test kits to check for hidden blood in your stool.  A small camera at the end of a tube can be used to examine your colon directly (sigmoidoscopy or colonoscopy). This is done to check for the earliest forms of colorectal cancer.  Direct examination of the colon should be repeated every 5-10 years until age 42. However, if early forms of precancerous polyps or small growths are found or if you have a family history or genetic risk for colorectal cancer, you may need to be screened more often.  Skin Cancer  Check your skin from head to toe regularly.  Monitor any moles. Be sure to tell your health care provider: ? About any new moles or changes in moles, especially if there is a change in a mole's shape or color. ? If you have a mole that is larger than the size of a pencil eraser.  If any of your family members has a history of skin cancer, especially at a young age, talk with your health care provider about genetic screening.  Always use sunscreen. Apply sunscreen liberally and repeatedly throughout the day.  Whenever you are outside, protect yourself by wearing long sleeves, pants, a wide-brimmed hat, and sunglasses.  What should I know about osteoporosis? Osteoporosis is a condition in which bone destruction happens more quickly than new bone creation. After menopause, you may be at an increased risk for osteoporosis. To help prevent osteoporosis or the bone fractures that can happen because of osteoporosis, the following is recommended:  If you are 43-45 years old, get at least 1,000 mg of calcium and at least 600 mg of vitamin D per day.  If you are older than age 66 but younger than age 4, get at least 1,200 mg of calcium and at least 600 mg of vitamin D per day.  If you are older than age 71, get at least 1,200 mg of calcium and at least 800 mg of vitamin D per  day.  Smoking and excessive alcohol intake increase the risk of osteoporosis. Eat foods that are rich in calcium and vitamin D, and do weight-bearing exercises several times each week as directed by your health care provider. What should I know about how menopause affects my mental health? Depression may occur at any age, but it is more common as you become older. Common symptoms of depression include:  Low or sad mood.  Changes in sleep patterns.  Changes in appetite or eating patterns.  Feeling an overall lack of motivation or enjoyment of activities that you previously enjoyed.  Frequent crying spells.  Talk with your health care provider if you think that you are experiencing depression. What should I know about immunizations? It is important that you get and maintain your  immunizations. These include:  Tetanus, diphtheria, and pertussis (Tdap) booster vaccine.  Influenza every year before the flu season begins.  Pneumonia vaccine.  Shingles vaccine.  Your health care provider may also recommend other immunizations. This information is not intended to replace advice given to you by your health care provider. Make sure you discuss any questions you have with your health care provider. Document Released: 11/14/2005 Document Revised: 04/11/2016 Document Reviewed: 06/26/2015 Elsevier Interactive Patient Education  2018 [employer].

## 2017-04-13 DIAGNOSIS — R1319 Other dysphagia: Secondary | ICD-10-CM | POA: Diagnosis not present

## 2017-06-02 ENCOUNTER — Encounter: Payer: Self-pay | Admitting: *Deleted

## 2017-06-03 ENCOUNTER — Encounter: Payer: Self-pay | Admitting: *Deleted

## 2017-06-03 ENCOUNTER — Encounter
Admission: RE | Disposition: A | Discharge status: Home / Self Care | Payer: Self-pay | Source: Ambulatory Visit | Attending: Unknown Physician Specialty

## 2017-06-03 ENCOUNTER — Ambulatory Visit
Admission: RE | Admit: 2017-06-03 | Discharge: 2017-06-03 | Disposition: A | Discharge status: Home / Self Care | Payer: 59 | Source: Ambulatory Visit | Attending: Unknown Physician Specialty | Admitting: Unknown Physician Specialty

## 2017-06-03 ENCOUNTER — Ambulatory Visit: Payer: 59 | Admitting: Anesthesiology

## 2017-06-03 DIAGNOSIS — Z539 Procedure and treatment not carried out, unspecified reason: Secondary | ICD-10-CM | POA: Insufficient documentation

## 2017-06-03 DIAGNOSIS — R1310 Dysphagia, unspecified: Secondary | ICD-10-CM | POA: Insufficient documentation

## 2017-06-03 DIAGNOSIS — R009 Unspecified abnormalities of heart beat: Secondary | ICD-10-CM | POA: Diagnosis not present

## 2017-06-03 DIAGNOSIS — I479 Paroxysmal tachycardia, unspecified: Secondary | ICD-10-CM | POA: Diagnosis not present

## 2017-06-03 HISTORY — DX: Supraventricular tachycardia, unspecified: I47.10

## 2017-06-03 HISTORY — DX: Supraventricular tachycardia: I47.1

## 2017-06-03 HISTORY — DX: Compression of vein: I87.1

## 2017-06-03 HISTORY — DX: Hyperlipidemia, unspecified: E78.5

## 2017-06-03 HISTORY — DX: Malignant (primary) neoplasm, unspecified: C80.1

## 2017-06-03 HISTORY — DX: Gastro-esophageal reflux disease without esophagitis: K21.9

## 2017-06-03 HISTORY — DX: Hypothyroidism, unspecified: E03.9

## 2017-06-03 SURGERY — ESOPHAGOGASTRODUODENOSCOPY (EGD) WITH PROPOFOL
Anesthesia: General

## 2017-06-03 MED ORDER — ADENOSINE 6 MG/2ML IV SOLN
6.0000 mg | Freq: Once | INTRAVENOUS | Status: AC
  Administered 2017-06-03: 6 mg via INTRAVENOUS
  Filled 2017-06-03: qty 2

## 2017-06-03 MED ORDER — GLYCOPYRROLATE 0.2 MG/ML IJ SOLN
INTRAMUSCULAR | Status: AC
  Filled 2017-06-03: qty 1

## 2017-06-03 MED ORDER — SODIUM CHLORIDE 0.9 % IV SOLN
INTRAVENOUS | Status: DC
  Administered 2017-06-03: 1000 mL via INTRAVENOUS

## 2017-06-03 MED ORDER — LIDOCAINE HCL (PF) 2 % IJ SOLN
INTRAMUSCULAR | Status: AC
  Filled 2017-06-03: qty 2

## 2017-06-03 MED ORDER — PROPOFOL 10 MG/ML IV BOLUS
INTRAVENOUS | Status: AC
  Filled 2017-06-03: qty 20

## 2017-06-03 MED ORDER — PROPOFOL 500 MG/50ML IV EMUL
INTRAVENOUS | Status: AC
  Filled 2017-06-03: qty 50

## 2017-06-03 NOTE — Consult Note (Signed)
[facility] Consultation Note  Patient ID: Sara Moss, MRN: [id number], DOB/AGE: [birth year] 54 y.o. Admit date: 06/03/2017   Date of Consult: 06/03/2017 Primary Physician: Adin Hector, MD Primary Cardiologist: fath  Chief Complaint: No chief complaint on file.  Reason for Consult: av node rentry  HPI: 55 y.o. female with svt and multiple episodes in the past with tx by valvsalva and sometimes extra b-blocker with no current significant sx while now in reenty at 200bpm. Adenosine 6mg  has broken her back to nsr and stable. No other concerns at this time.  Past Medical History:  Diagnosis Date  . Cancer ([place])   . GERD (gastroesophageal reflux disease)   . Hyperlipidemia   . Hypertension   . Hypothyroidism   . Migraines   . Mitral valve prolapse   . Psoriasis   . Psoriasis   . SVT (supraventricular tachycardia) ([place])   . Thyroid disease    Hypothyroid  . Vein compression       Surgical History:  Past Surgical History:  Procedure Laterality Date  . IR SCLEROTHERAPY OF A FLUID COLLECTION    . MELANOMA EXCISION  2002  . REFRACTIVE SURGERY    . TOTAL ABDOMINAL HYSTERECTOMY  1999     Home Meds: Prior to Admission medications   Medication Sig Start Date End Date Taking? Authorizing Provider  lisinopril (PRINIVIL,ZESTRIL) 10 MG tablet Take 10 mg by mouth daily. 10/07/16  Yes [provider]  almotriptan (AXERT) 12.5 MG tablet Take 12.5 mg by mouth as needed. 08/19/16   [provider]  ALPRAZolam Duanne Moron) 0.25 MG tablet Take 0.25 mg by mouth as needed. 05/12/16   [provider]  cyclobenzaprine (FLEXERIL) 10 MG tablet Take 1 tablet (10 mg total) by mouth every 8 (eight) hours as needed for muscle spasms. 12/05/16   Jaclyn Prime, Collene Leyden, PA-C  metoprolol tartrate (LOPRESSOR) 25 MG tablet Take by mouth. 11/12/16 11/12/17  [provider]  Prasterone (INTRAROSA) 6.5 MG INST Place 6.5 mg vaginally at bedtime. 03/11/17   Defrancesco,  Alanda Slim, MD  promethazine (PHENERGAN) 25 MG tablet Take 1 tablet (25 mg total) by mouth every 6 (six) hours as needed for nausea or vomiting. 10/24/16   Jaclyn Prime, Collene Leyden, PA-C  SUMAtriptan (IMITREX) 100 MG tablet Take 1 tablet (100 mg total) by mouth once as needed for migraine. May repeat in 2 hours if headache persists or recurs. 12/05/16   Teague Carlis Abbott, Collene Leyden, PA-C  SYNTHROID 100 MCG tablet Take 100 mcg by mouth daily. 10/02/16   [provider]    Inpatient Medications:   . sodium chloride 1,000 mL (06/03/17 1035)    Allergies: No Known Allergies  Social History   Social History  . Marital status: Married    Spouse name: N/A  . Number of children: N/A  . Years of education: N/A   Occupational History  . Not on file.   Social History Main Topics  . Smoking status: Never Smoker  . Smokeless tobacco: Never Used  . Alcohol use Yes     Comment: rare  . Drug use: No  . Sexual activity: Yes    Birth control/ protection: Surgical   Other Topics Concern  . Not on file   Social History Narrative  . No narrative on file     Family History  Problem Relation Age of Onset  . Cancer Father        esophageal  . Cancer Mother  cervical  . Ovarian cancer Neg Hx   . Breast cancer Neg Hx   . Colon cancer Neg Hx   . Diabetes Neg Hx   . Heart disease Neg Hx      Review of Systems Positive for palps Negative for: General:  chills, fever, night sweats or weight changes.  Cardiovascular: PND orthopnea syncope dizziness  Dermatological skin lesions rashes Respiratory: Cough congestion Urologic: Frequent urination urination at night and hematuria Abdominal: negative for nausea, vomiting, diarrhea, bright red blood per rectum, melena, or hematemesis Neurologic: negative for visual changes, and/or hearing changes  All other systems reviewed and are otherwise negative except as noted above.  Labs: No results for input(s): CKTOTAL, CKMB, TROPONINI in the  last 72 hours. Lab Results  Component Value Date   WBC 5.5 02/09/2013   HGB 12.6 02/09/2013   HCT 37.6 02/09/2013   MCV 87 02/09/2013   PLT 224 02/09/2013   No results for input(s): NA, K, CL, CO2, BUN, CREATININE, CALCIUM, PROT, BILITOT, ALKPHOS, ALT, AST, GLUCOSE in the last 168 hours.  Invalid input(s): LABALBU Lab Results  Component Value Date   CHOL 224 (H) 02/09/2013   HDL 89 (H) 02/09/2013   LDLCALC 126 (H) 02/09/2013   TRIG 43 02/09/2013   No results found for: DDIMER  Radiology/Studies:  No results found.  EKG: av node renetry  Weights: [employer]   06/03/17 1021  Weight: 61.7 kg (136 lb)     Physical Exam: Blood pressure (!) 148/81, pulse (!) (P) 191, temperature 97.8 F (36.6 C), temperature source Tympanic, resp. rate 16, height 5\' 8"  (1.727 m), weight 61.7 kg (136 lb), SpO2 100 %. Body mass index is 20.68 kg/m. General: Well developed, well nourished, in no acute distress. Head eyes ears nose throat: Normocephalic, atraumatic, sclera non-icteric, no xanthomas, nares are without discharge. No apparent thyromegaly and/or mass  Lungs: Normal respiratory effort.  no wheezes, no rales, no rhonchi.  Heart: RRR with normal S1 S2. no murmur gallop, no rub, PMI is normal size and placement, carotid upstroke normal without bruit, jugular venous pressure is normal Abdomen: Soft, non-tender, non-distended with normoactive bowel sounds. No hepatomegaly. No rebound/guarding. No obvious abdominal masses. Abdominal aorta is normal size without bruit Extremities: No edema. no cyanosis, no clubbing, no ulcers  Peripheral : 2+ bilateral upper extremity pulses, 2+ bilateral femoral pulses, 2+ bilateral dorsal pedal pulse Neuro: Alert and oriented. No facial asymmetry. No focal deficit. Moves all extremities spontaneously. Musculoskeletal: Normal muscle tone without kyphosis Psych:  Responds to questions appropriately with a normal affect.    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 [facility] 06/03/2017, 11:56 AM

## 2017-06-03 NOTE — Anesthesia Preprocedure Evaluation (Signed)
Anesthesia Evaluation  Patient identified by MRN, date of birth, ID band Patient awake    Reviewed: Allergy & Precautions, NPO status , Patient's Chart, lab work & pertinent test results, reviewed documented beta blocker date and time   Airway Mallampati: II  TM Distance: >3 FB     Dental  (+) Chipped   Pulmonary           Cardiovascular hypertension, Pt. on medications and Pt. on home beta blockers      Neuro/Psych  Headaches,    GI/Hepatic GERD  Poorly Controlled,  Endo/Other  diabetes, Type 2Hypothyroidism   Renal/GU      Musculoskeletal   Abdominal   Peds  Hematology   Anesthesia Other Findings   Reproductive/Obstetrics                             Anesthesia Physical Anesthesia Plan  ASA: III  Anesthesia Plan: General   Post-op Pain Management:    Induction: Intravenous  PONV Risk Score and Plan:   Airway Management Planned:   Additional Equipment:   Intra-op Plan:   Post-operative Plan:   Informed Consent: I have reviewed the patients History and Physical, chart, labs and discussed the procedure including the risks, benefits and alternatives for the proposed anesthesia with the patient or authorized representative who has indicated his/her understanding and acceptance.     Plan Discussed with: CRNA  Anesthesia Plan Comments:         Anesthesia Quick Evaluation

## 2017-06-03 NOTE — OR Nursing (Signed)
1140 treated by Dr. Nehemiah Massed for elevated heart rate. Heart rate decreased to acceptable level after treatment. Okay for discharge with follow-up with Dr. Ubaldo Glassing per Dr. Nehemiah Massed.

## 2017-06-04 MED FILL — Medication: Qty: 1 | Status: AC

## 2017-07-13 ENCOUNTER — Encounter: Payer: Self-pay | Admitting: Internal Medicine

## 2017-07-13 ENCOUNTER — Ambulatory Visit (INDEPENDENT_AMBULATORY_CARE_PROVIDER_SITE_OTHER): Payer: 59 | Admitting: Internal Medicine

## 2017-07-13 VITALS — BP 132/84 | HR 54 | Ht 68.0 in | Wt 140.6 lb

## 2017-07-13 DIAGNOSIS — I10 Essential (primary) hypertension: Secondary | ICD-10-CM

## 2017-07-13 DIAGNOSIS — I471 Supraventricular tachycardia: Secondary | ICD-10-CM

## 2017-07-13 NOTE — Patient Instructions (Addendum)
Medication Instructions:  Your physician recommends that you continue on your current medications as directed. Please refer to the Current Medication list given to you today.   Labwork: Your physician recommends that you return for lab work on---             You do not have to fast.   Testing/Procedures: Your physician has recommended that you have an ablation. Catheter ablation is a medical procedure used to treat some cardiac arrhythmias (irregular heartbeats). During catheter ablation, a long, thin, flexible tube is put into a blood vessel in your groin (upper thigh), or neck. This tube is called an ablation catheter. It is then guided to your heart through the blood vessel. Radio frequency waves destroy small areas of heart tissue where abnormal heartbeats may cause an arrhythmia to start. Please see the instruction sheet given to you today.  Dates available:  11/7,11/8,11/13,11/16,11/20,11/23,11/27  Please arrive at [facility] at Do not eat or drink after midnight the night prior to the procedure Do not take any medications the morning of the test Plan for one night stay Will need someone to drive you home at discharge    Follow-Up: Your physician recommends that you schedule a follow-up appointment in: 4 weeks from date of procedure with Dr. Rayann Heman.   Please call to schedule  [phone]---needs CARTO and ANES

## 2017-07-13 NOTE — Progress Notes (Signed)
Electrophysiology Office Note   Date:  07/13/2017   ID:  Sara Moss, Sara Moss November 12, 1960, MRN [id number]  PCP:  Adin Hector, MD  Cardiologist:  Dr Ubaldo Glassing Primary Electrophysiologist: Thompson Grayer, MD    Chief Complaint  Patient presents with  . New Patient (Initial Visit)    SVT     History of Present Illness: Sara Moss is a 56 y.o. female who presents today for electrophysiology evaluation.   She is referred by Dr Sara Moss (consult note from 06/03/17 is reviewed) for SVT evaluation.  She reports having abrupt onset/offset of tachypalpitations since she was a teenage.  She reports associated anxiety and palpitations. She has typically been able to terminate episodes with valsalva maneuvers.  Episodes have increased in frequency to near daily at times.  She had short RP SVT documented during endoscopy 06/03/17 and required adenosine to terminate.  Today, she denies symptoms of chest pain, shortness of breath, orthopnea, PND, lower extremity edema, claudication, dizziness, presyncope, syncope, bleeding, or neurologic sequela. The patient is tolerating medications without difficulties and is otherwise without complaint today.    Past Medical History:  Diagnosis Date  . Arrhythmia    SVT  . Cancer ([place])   . GERD (gastroesophageal reflux disease)   . Hx of skin cancer, basal cell   . Hyperlipidemia   . Hypertension   . Hyperthyroidism with Hashimoto disease   . Hypothyroidism   . Hypothyroidism   . Migraines   . Mitral valve prolapse   . Other dysphagia   . Paroxysmal SVT (supraventricular tachycardia) ([place])   . Periodic headache syndrome   . Psoriasis   . Psoriasis   . SVT (supraventricular tachycardia) ([place])   . Thyroid disease    Hypothyroid  . Varicose veins of legs   . Vein compression    Past Surgical History:  Procedure Laterality Date  . IR SCLEROTHERAPY OF A FLUID COLLECTION    . MELANOMA EXCISION  2002  . REFRACTIVE SURGERY    . TOTAL ABDOMINAL  HYSTERECTOMY  1999     Current Outpatient Prescriptions  Medication Sig Dispense Refill  . almotriptan (AXERT) 12.5 MG tablet Take 12.5 mg by mouth as needed for migraine (As directed).     . ALPRAZolam (XANAX) 0.25 MG tablet Take 0.25 mg by mouth as needed (As directed).     . cyclobenzaprine (FLEXERIL) 10 MG tablet Take 1 tablet (10 mg total) by mouth every 8 (eight) hours as needed for muscle spasms. 30 tablet 1  . metoprolol succinate (TOPROL-XL) 25 MG 24 hr tablet Take 25 mg by mouth daily.    . Prasterone (INTRAROSA) 6.5 MG INST Place 6.5 mg vaginally at bedtime. 30 each 6  . promethazine (PHENERGAN) 25 MG tablet Take 1 tablet (25 mg total) by mouth every 6 (six) hours as needed for nausea or vomiting. 30 tablet 0  . SUMAtriptan (IMITREX) 100 MG tablet Take 1 tablet (100 mg total) by mouth once as needed for migraine. May repeat in 2 hours if headache persists or recurs. 9 tablet 2  . SYNTHROID 100 MCG tablet Take 100 mcg by mouth daily.  3   No current facility-administered medications for this visit.     Allergies:   Patient has no known allergies.   Social History:  The patient  reports that she has never smoked. She has never used smokeless tobacco. She reports that she drinks alcohol. She reports that she does not use drugs.   Family History:  The patient's  family history includes Cancer in her father and mother.    ROS:  Please see the history of present illness.   All other systems are personally reviewed and negative.    PHYSICAL EXAM: VS:  BP 132/84   Pulse (!) 54   Ht 5\' 8"  (1.727 m)   Wt 140 lb 9.6 oz (63.8 kg)   SpO2 95%   BMI 21.38 kg/m  , BMI Body mass index is 21.38 kg/m. GEN: Well nourished, well developed, in no acute distress  HEENT: normal  Neck: no JVD, carotid bruits, or masses Cardiac: RRR; no murmurs, rubs, or gallops,no edema  Respiratory:  clear to auscultation bilaterally, normal work of breathing GI: soft, nontender, nondistended, + BS MS:  no deformity or atrophy  Skin: warm and dry  Neuro:  Strength and sensation are intact Psych: euthymic mood, full affect  EKG:  EKG 06/03/17 reveal short RP SVT    Recent Labs: No results found for requested labs within last 8760 hours.  personally reviewed   Lipid Panel     Component Value Date/Time   CHOL 224 (H) 02/09/2013 0802   TRIG 43 02/09/2013 0802   HDL 89 (H) 02/09/2013 0802   VLDL 9 02/09/2013 0802   LDLCALC 126 (H) 02/09/2013 0802   personally reviewed   Wt Readings from Last 3 Encounters:  07/13/17 140 lb 9.6 oz (63.8 kg)  06/03/17 136 lb (61.7 kg)  03/11/17 143 lb 14.4 oz (65.3 kg)      Other studies personally reviewed: Additional studies/ records that were reviewed today include: echo 11/25/16  Review of the above records today demonstrates: normal ef, no significant valvular disease   ASSESSMENT AND PLAN:  1.  SVT She has documented short RP SVT.  She has failed medical therapy with beta blockers. Therapeutic strategies for supraventricular tachycardia including medicine and ablation were discussed in detail with the patient today. Risk, benefits, and alternatives to EP study and radiofrequency ablation were also discussed in detail today. These risks include but are not limited to stroke, bleeding, vascular damage, tamponade, perforation, damage to the heart and other structures, AV block requiring pacemaker, worsening renal function, and death. The patient understands these risk and wishes to proceed.   She will review her work schedule and contact our office when she is ready to proceed.  Carto and Anesthesia are requested.  Hold beta blocker for 48 hours prior to ablation  2. HTN Stable No change required today   Current medicines are reviewed at length with the patient today.   The patient does not have concerns regarding her medicines.  The following changes were made today:  none    Signed, Thompson Grayer, MD  07/13/2017 9:38 AM     [facility]  [facility] [address] [place] [address] [phone] (office) [phone] (fax)

## 2017-09-02 ENCOUNTER — Other Ambulatory Visit: Payer: Self-pay

## 2017-09-02 MED ORDER — CYCLOBENZAPRINE HCL 10 MG PO TABS: 10.0000 mg | ORAL_TABLET | Freq: Three times a day (TID) | ORAL | 1 refills | Status: AC | PRN

## 2017-09-02 NOTE — Telephone Encounter (Signed)
Refill on flexeril sent to [facility]

## 2017-11-10 DIAGNOSIS — R1319 Other dysphagia: Secondary | ICD-10-CM | POA: Diagnosis not present

## 2018-02-04 ENCOUNTER — Encounter: Payer: Self-pay | Admitting: *Deleted

## 2018-02-05 ENCOUNTER — Ambulatory Visit: Payer: 59 | Admitting: Anesthesiology

## 2018-02-05 ENCOUNTER — Encounter: Payer: Self-pay | Admitting: Anesthesiology

## 2018-02-05 ENCOUNTER — Ambulatory Visit
Admission: RE | Admit: 2018-02-05 | Discharge: 2018-02-05 | Disposition: A | Discharge status: Home / Self Care | Payer: 59 | Source: Ambulatory Visit | Attending: Unknown Physician Specialty | Admitting: Unknown Physician Specialty

## 2018-02-05 ENCOUNTER — Encounter
Admission: RE | Disposition: A | Discharge status: Home / Self Care | Payer: Self-pay | Source: Ambulatory Visit | Attending: Unknown Physician Specialty

## 2018-02-05 DIAGNOSIS — E119 Type 2 diabetes mellitus without complications: Secondary | ICD-10-CM | POA: Insufficient documentation

## 2018-02-05 DIAGNOSIS — I471 Supraventricular tachycardia: Secondary | ICD-10-CM | POA: Insufficient documentation

## 2018-02-05 DIAGNOSIS — E0590 Thyrotoxicosis, unspecified without thyrotoxic crisis or storm: Secondary | ICD-10-CM | POA: Insufficient documentation

## 2018-02-05 DIAGNOSIS — E785 Hyperlipidemia, unspecified: Secondary | ICD-10-CM | POA: Diagnosis not present

## 2018-02-05 DIAGNOSIS — I8390 Asymptomatic varicose veins of unspecified lower extremity: Secondary | ICD-10-CM | POA: Diagnosis not present

## 2018-02-05 DIAGNOSIS — R1310 Dysphagia, unspecified: Secondary | ICD-10-CM | POA: Insufficient documentation

## 2018-02-05 DIAGNOSIS — L409 Psoriasis, unspecified: Secondary | ICD-10-CM | POA: Insufficient documentation

## 2018-02-05 DIAGNOSIS — I341 Nonrheumatic mitral (valve) prolapse: Secondary | ICD-10-CM | POA: Diagnosis not present

## 2018-02-05 DIAGNOSIS — K2970 Gastritis, unspecified, without bleeding: Secondary | ICD-10-CM | POA: Diagnosis not present

## 2018-02-05 DIAGNOSIS — E063 Autoimmune thyroiditis: Secondary | ICD-10-CM | POA: Insufficient documentation

## 2018-02-05 DIAGNOSIS — Z90710 Acquired absence of both cervix and uterus: Secondary | ICD-10-CM | POA: Insufficient documentation

## 2018-02-05 DIAGNOSIS — Z859 Personal history of malignant neoplasm, unspecified: Secondary | ICD-10-CM | POA: Insufficient documentation

## 2018-02-05 DIAGNOSIS — G43901 Migraine, unspecified, not intractable, with status migrainosus: Secondary | ICD-10-CM | POA: Diagnosis not present

## 2018-02-05 DIAGNOSIS — K219 Gastro-esophageal reflux disease without esophagitis: Secondary | ICD-10-CM | POA: Insufficient documentation

## 2018-02-05 DIAGNOSIS — I499 Cardiac arrhythmia, unspecified: Secondary | ICD-10-CM | POA: Insufficient documentation

## 2018-02-05 DIAGNOSIS — K2950 Unspecified chronic gastritis without bleeding: Secondary | ICD-10-CM | POA: Insufficient documentation

## 2018-02-05 DIAGNOSIS — I10 Essential (primary) hypertension: Secondary | ICD-10-CM | POA: Insufficient documentation

## 2018-02-05 DIAGNOSIS — Z79899 Other long term (current) drug therapy: Secondary | ICD-10-CM | POA: Insufficient documentation

## 2018-02-05 DIAGNOSIS — Z800 Family history of malignant neoplasm of digestive organs: Secondary | ICD-10-CM | POA: Insufficient documentation

## 2018-02-05 DIAGNOSIS — Z85828 Personal history of other malignant neoplasm of skin: Secondary | ICD-10-CM | POA: Diagnosis not present

## 2018-02-05 DIAGNOSIS — Z8049 Family history of malignant neoplasm of other genital organs: Secondary | ICD-10-CM | POA: Diagnosis not present

## 2018-02-05 DIAGNOSIS — I739 Peripheral vascular disease, unspecified: Secondary | ICD-10-CM | POA: Diagnosis not present

## 2018-02-05 HISTORY — DX: Dysphagia, unspecified: R13.10

## 2018-02-05 HISTORY — PX: ESOPHAGOGASTRODUODENOSCOPY (EGD) WITH PROPOFOL: SHX5813

## 2018-02-05 SURGERY — ESOPHAGOGASTRODUODENOSCOPY (EGD) WITH PROPOFOL
Anesthesia: General

## 2018-02-05 MED ORDER — FENTANYL CITRATE (PF) 100 MCG/2ML IJ SOLN
INTRAMUSCULAR | Status: AC
  Filled 2018-02-05: qty 2

## 2018-02-05 MED ORDER — SODIUM CHLORIDE 0.9 % IV SOLN
INTRAVENOUS | Status: DC
  Administered 2018-02-05: 1000 mL via INTRAVENOUS

## 2018-02-05 MED ORDER — MIDAZOLAM HCL 5 MG/5ML IJ SOLN
INTRAMUSCULAR | Status: DC | PRN
  Administered 2018-02-05: 2 mg via INTRAVENOUS

## 2018-02-05 MED ORDER — FENTANYL CITRATE (PF) 100 MCG/2ML IJ SOLN
INTRAMUSCULAR | Status: DC | PRN
  Administered 2018-02-05 (×2): 50 ug via INTRAVENOUS

## 2018-02-05 MED ORDER — LIDOCAINE HCL (PF) 1 % IJ SOLN
INTRAMUSCULAR | Status: AC
  Administered 2018-02-05: 0.3 mL via INTRADERMAL
  Filled 2018-02-05: qty 2

## 2018-02-05 MED ORDER — LIDOCAINE HCL (PF) 2 % IJ SOLN
INTRAMUSCULAR | Status: DC | PRN
  Administered 2018-02-05: 60 mg

## 2018-02-05 MED ORDER — LIDOCAINE HCL (PF) 1 % IJ SOLN
2.0000 mL | Freq: Once | INTRAMUSCULAR | Status: AC
  Administered 2018-02-05: 0.3 mL via INTRADERMAL

## 2018-02-05 MED ORDER — MIDAZOLAM HCL 2 MG/2ML IJ SOLN
INTRAMUSCULAR | Status: AC
  Filled 2018-02-05: qty 2

## 2018-02-05 MED ORDER — PROPOFOL 10 MG/ML IV BOLUS
INTRAVENOUS | Status: DC | PRN
  Administered 2018-02-05: 30 mg via INTRAVENOUS
  Administered 2018-02-05: 40 mg via INTRAVENOUS
  Administered 2018-02-05: 20 mg via INTRAVENOUS

## 2018-02-05 MED ORDER — SODIUM CHLORIDE 0.9 % IV SOLN: INTRAVENOUS | Status: DC

## 2018-02-05 MED ORDER — PROPOFOL 10 MG/ML IV BOLUS
INTRAVENOUS | Status: AC
  Filled 2018-02-05: qty 20

## 2018-02-05 MED ORDER — LIDOCAINE HCL (PF) 2 % IJ SOLN
INTRAMUSCULAR | Status: AC
  Filled 2018-02-05: qty 10

## 2018-02-05 MED ORDER — PROPOFOL 500 MG/50ML IV EMUL
INTRAVENOUS | Status: DC | PRN
  Administered 2018-02-05: 75 ug/kg/min via INTRAVENOUS

## 2018-02-05 NOTE — H&P (Signed)
Primary Care Physician:  Adin Hector, MD Primary Gastroenterologist:  Dr. Vira Agar  Pre-Procedure History & Physical: HPI:  Sara Moss is a 57 y.o. female is here for an endoscopy.Done for dysphagia, dilatation planned.   Past Medical History:  Diagnosis Date  . Arrhythmia   . Cancer ([place])   . Dysphagia   . GERD (gastroesophageal reflux disease)   . Hx of skin cancer, basal cell   . Hyperlipidemia   . Hypertension   . Hyperthyroidism with Hashimoto disease   . Hypothyroidism   . Migraines   . Mitral valve prolapse   . Other dysphagia   . Periodic headache syndrome   . Psoriasis   . SVT (supraventricular tachycardia) ([facility])    adenosine sensitive short RP SVT  . Thyroid disease    Hypothyroid  . Varicose veins of legs   . Varicose veins of legs   . Vein compression     Past Surgical History:  Procedure Laterality Date  . IR SCLEROTHERAPY OF A FLUID COLLECTION    . MELANOMA EXCISION  2002  . REFRACTIVE SURGERY    . TOTAL ABDOMINAL HYSTERECTOMY  1999    Prior to Admission medications   Medication Sig Start Date End Date Taking? Authorizing Provider  lisinopril (PRINIVIL,ZESTRIL) 10 MG tablet Take 10 mg by mouth daily.   Yes [provider]  almotriptan (AXERT) 12.5 MG tablet Take 12.5 mg by mouth as needed for migraine (As directed).  08/19/16   [provider]  ALPRAZolam Duanne Moron) 0.25 MG tablet Take 0.25 mg by mouth as needed (As directed).  05/12/16   [provider]  cyclobenzaprine (FLEXERIL) 10 MG tablet Take 1 tablet (10 mg total) by mouth every 8 (eight) hours as needed for muscle spasms. 09/02/17   Caren Macadam, MD  metoprolol succinate (TOPROL-XL) 25 MG 24 hr tablet Take 25 mg by mouth daily. 06/18/17 06/18/18  [provider]  Prasterone (INTRAROSA) 6.5 MG INST Place 6.5 mg vaginally at bedtime. 03/11/17   Defrancesco, Alanda Slim, MD  promethazine (PHENERGAN) 25 MG tablet Take 1 tablet (25 mg total) by mouth every  6 (six) hours as needed for nausea or vomiting. 10/24/16   Jaclyn Prime, Collene Leyden, PA-C  SUMAtriptan (IMITREX) 100 MG tablet Take 1 tablet (100 mg total) by mouth once as needed for migraine. May repeat in 2 hours if headache persists or recurs. 12/05/16   Teague Carlis Abbott, Collene Leyden, PA-C  SYNTHROID 100 MCG tablet Take 100 mcg by mouth daily. 10/02/16   [provider]    Allergies as of 11/22/2017  . (No Known Allergies)    Family History  Problem Relation Age of Onset  . Cancer Father        esophageal  . Cancer Mother        cervical  . Ovarian cancer Neg Hx   . Breast cancer Neg Hx   . Colon cancer Neg Hx   . Diabetes Neg Hx   . Heart disease Neg Hx     Social History   Socioeconomic History  . Marital status: Married    Spouse name: Not on file  . Number of children: Not on file  . Years of education: Not on file  . Highest education level: Not on file  Occupational History  . Not on file  Social Needs  . Financial resource strain: Not on file  . Food insecurity:    Worry: Not on file    Inability:  Not on file  . Transportation needs:    Medical: Not on file    Non-medical: Not on file  Tobacco Use  . Smoking status: Never Smoker  . Smokeless tobacco: Never Used  Substance and Sexual Activity  . Alcohol use: Yes    Comment: rare  . Drug use: No  . Sexual activity: Yes    Birth control/protection: Surgical  Lifestyle  . Physical activity:    Days per week: Not on file    Minutes per session: Not on file  . Stress: Not on file  Relationships  . Social connections:    Talks on phone: Not on file    Gets together: Not on file    Attends religious service: Not on file    Active member of club or organization: Not on file    Attends meetings of clubs or organizations: Not on file    Relationship status: Not on file  . Intimate partner violence:    Fear of current or ex partner: Not on file    Emotionally abused: Not on file    Physically abused: Not on  file    Forced sexual activity: Not on file  Other Topics Concern  . 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 Neck:  Supple; no masses or thyromegaly. Lungs:  Clear throughout to auscultation.    Heart:  Regular rate and rhythm. Abdomen:  Soft, nontender and nondistended. Normal bowel sounds, without guarding, and without rebound.   Neurologic:  Alert and  oriented x4;  grossly normal neurologically.  Impression/Plan: Sara Moss is here for an endoscopy to be performed for dysphagia.  Risks, benefits, limitations, and alternatives regarding  endoscopy have been reviewed with the patient.  Questions have been answered.  All parties agreeable.   Gaylyn Cheers, MD  02/05/2018, 8:10 AM

## 2018-02-05 NOTE — Transfer of Care (Signed)
Immediate Anesthesia Transfer of Care Note  Patient: Sara Moss  Procedure(s) Performed: ESOPHAGOGASTRODUODENOSCOPY (EGD) WITH PROPOFOL (N/A )  Patient Location: PACU  Anesthesia Type:General  Level of Consciousness: sedated  Airway & Oxygen Therapy: Patient Spontanous Breathing  Post-op Assessment: Report given to RN and Post -op Vital signs reviewed and stable  Post vital signs: Reviewed and stable  Last Vitals:  Vitals Value Taken Time  BP 130/77 02/05/2018  8:30 AM  Temp 36.1 C 02/05/2018  8:29 AM  Pulse 67 02/05/2018  8:30 AM  Resp 9 02/05/2018  8:30 AM  SpO2 100 % 02/05/2018  8:30 AM  Vitals shown include unvalidated device data.  Last Pain:  Vitals:   02/05/18 0829  TempSrc: Tympanic  PainSc: 0-No pain         Complications: No apparent anesthesia complications

## 2018-02-05 NOTE — Anesthesia Preprocedure Evaluation (Signed)
Anesthesia Evaluation  Patient identified by MRN, date of birth, ID band Patient awake    Reviewed: Allergy & Precautions, NPO status , Patient's Chart, lab work & pertinent test results, reviewed documented beta blocker date and time   Airway Mallampati: II  TM Distance: >3 FB     Dental  (+) Chipped   Pulmonary    Pulmonary exam normal        Cardiovascular hypertension, Pt. on medications and Pt. on home beta blockers + Peripheral Vascular Disease  Normal cardiovascular exam+ dysrhythmias Supra Ventricular Tachycardia      Neuro/Psych  Headaches,    GI/Hepatic GERD  ,  Endo/Other  diabetes, Type 2Hypothyroidism   Renal/GU      Musculoskeletal   Abdominal Normal abdominal exam  (+)   Peds  Hematology   Anesthesia Other Findings   Reproductive/Obstetrics                             Anesthesia Physical  Anesthesia Plan  ASA: II  Anesthesia Plan: General   Post-op Pain Management:    Induction: Intravenous  PONV Risk Score and Plan:   Airway Management Planned: Nasal Cannula  Additional Equipment:   Intra-op Plan:   Post-operative Plan:   Informed Consent: I have reviewed the patients History and Physical, chart, labs and discussed the procedure including the risks, benefits and alternatives for the proposed anesthesia with the patient or authorized representative who has indicated his/her understanding and acceptance.     Plan Discussed with: CRNA  Anesthesia Plan Comments:         Anesthesia Quick Evaluation

## 2018-02-05 NOTE — Anesthesia Postprocedure Evaluation (Signed)
Anesthesia Post Note  Patient: Sara Moss  Procedure(s) Performed: ESOPHAGOGASTRODUODENOSCOPY (EGD) WITH PROPOFOL (N/A )  Patient location during evaluation: PACU Anesthesia Type: General Level of consciousness: awake and alert and oriented Pain management: pain level controlled Vital Signs Assessment: post-procedure vital signs reviewed and stable Respiratory status: spontaneous breathing Cardiovascular status: blood pressure returned to baseline Anesthetic complications: no     Last Vitals:  Vitals:   02/05/18 0745 02/05/18 0829  BP: 140/83 130/77  Pulse: (!) 54   Resp: 17 12  Temp: 36.5 C (!) 36.1 C  SpO2: 100% 100%    Last Pain:  Vitals:   02/05/18 0829  TempSrc: Tympanic  PainSc: 0-No pain                 Anjalee Cope

## 2018-02-05 NOTE — Op Note (Addendum)
[facility] Patient Name: Sara Moss Procedure Date: 02/05/2018 8:10 AM MRN: [id number] Account #: [id number] Date of Birth: April 26, 1961 Admit Type: Outpatient Age: 57 Room: [facility] ENDO ROOM 3 Gender: Female Note Status: Finalized Procedure:            Upper GI endoscopy Indications:          Dysphagia Providers:            Manya Silvas, MD Referring MD:         Ramonita Lab, MD (Referring MD) Medicines:            Propofol per Anesthesia Complications:        No immediate complications. Procedure:            Pre-Anesthesia Assessment:                       - After reviewing the risks and benefits, the patient                        was deemed in satisfactory condition to undergo the                        procedure.                       After obtaining informed consent, the endoscope was                        passed under direct vision. Throughout the procedure,                        the patient's blood pressure, pulse, and oxygen                        saturations were monitored continuously. The Endoscope                        was introduced through the mouth, and advanced to the                        second part of duodenum. The upper GI endoscopy was                        accomplished without difficulty. The patient tolerated                        the procedure well. Findings:      The examined esophagus was normal. At the end of the procedure A       guidewire was placed and the scope was withdrawn. Dilation was performed       with a Savary dilator with minimal-mild resistance at 16 mm and 17 mm.      Diffuse and patchy mild inflammation characterized by erythema and       granularity was found in the gastric antrum. Biopsies were taken with a       cold forceps for histology. Biopsies were taken with a cold forceps for       Helicobacter pylori testing.      The examined duodenum was normal. Impression:           - Normal  esophagus. Dilated.                       -  Gastritis. Biopsied.                       - Normal examined duodenum. Recommendation:       - Await pathology results. Manya Silvas, MD 02/05/2018 8:31:00 AM This report has been signed electronically. Number of Addenda: 0 Note Initiated On: 02/05/2018 8:10 AM      [facility]

## 2018-02-05 NOTE — Anesthesia Post-op Follow-up Note (Signed)
Anesthesia QCDR form completed.        

## 2018-02-08 ENCOUNTER — Encounter: Payer: Self-pay | Admitting: Unknown Physician Specialty

## 2018-02-08 LAB — SURGICAL PATHOLOGY

## 2018-03-15 DIAGNOSIS — H52223 Regular astigmatism, bilateral: Secondary | ICD-10-CM | POA: Diagnosis not present

## 2018-04-05 ENCOUNTER — Other Ambulatory Visit: Payer: Self-pay | Admitting: Obstetrics and Gynecology

## 2018-05-17 ENCOUNTER — Telehealth: Payer: Self-pay | Admitting: Obstetrics and Gynecology

## 2018-05-17 MED ORDER — PRASTERONE 6.5 MG VA INST: 6.5000 mg | VAGINAL_INSERT | Freq: Every day | VAGINAL | 0 refills | Status: DC

## 2018-05-17 NOTE — Telephone Encounter (Signed)
Pt called Sara Moss back, and would like a call back if possible. Please advise.

## 2018-05-17 NOTE — Telephone Encounter (Signed)
The patient called and stated that she needs a refill on the medication INTRAROSA 6.5 MG INST [[id number]] Sent to her pharmacy [facility] outpatient pharmacy. Please advise.

## 2018-05-17 NOTE — Telephone Encounter (Signed)
Med erx.  VM full- unable to leave message. Mychart- code expired.

## 2018-05-17 NOTE — Telephone Encounter (Signed)
Pt aware.

## 2018-05-17 NOTE — Telephone Encounter (Signed)
Pls see previous telephone encounter.

## 2018-05-31 NOTE — Progress Notes (Signed)
ANNUAL PREVENTATIVE CARE GYN  ENCOUNTER NOTE  Subjective:       Sara Moss is a 57 y.o. G26P1001 female here for a routine annual gynecologic exam.  Current complaints: 1. none    No major interval health issues other than increased in vaginal secretions which began after chronic use of Intrarosa suppositories.  She denies vaginal itching, burning, or genital pain. Bowel function is normal. Bladder function is normal. Intrarosa suppositories are being used for vaginal dryness and have significantly improved discomfort with intercourse.  She still occasionally uses a vaginal lubricant Cecille Rubin is exercising regularly and eating healthy.  She is taking calcium with vitamin D.   Gynecologic History No LMP recorded. Patient has had a hysterectomy. Status post TAH/BSO Contraception: status post hysterectomy Last Pap: unsure. Results were: normal Last mammogram: 2015 birad 1. Results were: normal  Obstetric History OB History  Gravida Para Term Preterm AB Living  1 1 1     1   SAB TAB Ectopic Multiple Live Births          1    # Outcome Date GA Lbr Len/2nd Weight Sex Delivery Anes PTL Lv  1 Term 12/17/82 [redacted]w[redacted]d  7 lb 9 oz (3.43 kg) F Vag-Spont   LIV    Past Medical History:  Diagnosis Date  . Arrhythmia   . Cancer ([place])   . Dysphagia   . GERD (gastroesophageal reflux disease)   . Hx of skin cancer, basal cell   . Hyperlipidemia   . Hypertension   . Hyperthyroidism with Hashimoto disease   . Hypothyroidism   . Migraines   . Mitral valve prolapse   . Other dysphagia   . Periodic headache syndrome   . Psoriasis   . SVT (supraventricular tachycardia) ([facility])    adenosine sensitive short RP SVT  . Thyroid disease    Hypothyroid  . Varicose veins of legs   . Varicose veins of legs   . Vein compression     Past Surgical History:  Procedure Laterality Date  . ESOPHAGOGASTRODUODENOSCOPY (EGD) WITH PROPOFOL N/A 02/05/2018   Procedure: ESOPHAGOGASTRODUODENOSCOPY (EGD) WITH PROPOFOL;   Surgeon: Manya Silvas, MD;  Location: [facility] ENDOSCOPY;  Service: Endoscopy;  Laterality: N/A;  . IR SCLEROTHERAPY OF A FLUID COLLECTION    . MELANOMA EXCISION  2002  . REFRACTIVE SURGERY    . TOTAL ABDOMINAL HYSTERECTOMY  1999    Current Outpatient Medications on File Prior to Visit  Medication Sig Dispense Refill  . almotriptan (AXERT) 12.5 MG tablet Take 12.5 mg by mouth as needed for migraine (As directed).     . ALPRAZolam (XANAX) 0.25 MG tablet Take 0.25 mg by mouth as needed (As directed).     . cyclobenzaprine (FLEXERIL) 10 MG tablet Take 1 tablet (10 mg total) by mouth every 8 (eight) hours as needed for muscle spasms. 30 tablet 1  . lisinopril (PRINIVIL,ZESTRIL) 10 MG tablet Take 10 mg by mouth daily.    . metoprolol succinate (TOPROL-XL) 25 MG 24 hr tablet Take 25 mg by mouth daily.    . Prasterone (INTRAROSA) 6.5 MG INST Place 6.5 mg vaginally at bedtime. 30 each 0  . promethazine (PHENERGAN) 25 MG tablet Take 1 tablet (25 mg total) by mouth every 6 (six) hours as needed for nausea or vomiting. 30 tablet 0  . SUMAtriptan (IMITREX) 100 MG tablet Take 1 tablet (100 mg total) by mouth once as needed for migraine. May repeat in 2 hours if headache persists or recurs.  9 tablet 2  . SYNTHROID 100 MCG tablet Take 100 mcg by mouth daily.  3   No current facility-administered medications on file prior to visit.     No Known Allergies  Social History   Socioeconomic History  . Marital status: Married    Spouse name: Not on file  . Number of children: Not on file  . Years of education: Not on file  . Highest education level: Not on file  Occupational History  . Not on file  Social Needs  . Financial resource strain: Not on file  . Food insecurity:    Worry: Not on file    Inability: Not on file  . Transportation needs:    Medical: Not on file    Non-medical: Not on file  Tobacco Use  . Smoking status: Never Smoker  . Smokeless tobacco: Never Used  Substance and Sexual  Activity  . Alcohol use: Yes    Comment: rare  . Drug use: No  . Sexual activity: Yes    Birth control/protection: Surgical  Lifestyle  . Physical activity:    Days per week: Not on file    Minutes per session: Not on file  . Stress: Not on file  Relationships  . Social connections:    Talks on phone: Not on file    Gets together: Not on file    Attends religious service: Not on file    Active member of club or organization: Not on file    Attends meetings of clubs or organizations: Not on file    Relationship status: Not on file  . Intimate partner violence:    Fear of current or ex partner: Not on file    Emotionally abused: Not on file    Physically abused: Not on file    Forced sexual activity: Not on file  Other Topics Concern  . 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 Cancer Father        esophageal  . Cancer Mother        cervical  . Ovarian cancer Neg Hx   . Breast cancer Neg Hx   . Colon cancer Neg Hx   . Diabetes Neg Hx   . Heart disease Neg Hx     The following portions of the patient's history were reviewed and updated as appropriate: allergies, current medications, past family history, past medical history, past social history, past surgical history and problem list.  Review of Systems Review of Systems  Constitutional: Negative.   HENT: Negative.   Eyes: Negative.   Respiratory: Negative.   Cardiovascular: Negative.   Gastrointestinal: Negative.   Genitourinary:       Increased vaginal secretions Decreased dyspareunia with Intrarosa use  Musculoskeletal: Negative.   Skin: Negative.   Neurological: Negative.   Endo/Heme/Allergies: Negative.   Psychiatric/Behavioral: Negative.       Objective:   BP 114/74   Pulse (!) 52   Ht 5\' 8"  (1.727 m)   Wt 141 lb 9.6 oz (64.2 kg)   BMI 21.53 kg/m  CONSTITUTIONAL: Well-developed,  well-nourished female in no acute distress.  PSYCHIATRIC: Normal mood and affect. Normal behavior. Normal judgment and thought content. [place]: Alert and oriented to person, place, and time. Normal muscle tone coordination. No cranial nerve deficit noted. HENT:  Normocephalic, atraumatic, External right and left ear normal.  EYES: Conjunctivae and EOM are normal.No scleral icterus.  NECK: Normal range of motion, supple, no masses.  Normal thyroid.  SKIN: Skin is warm and dry. No rash noted. Not diaphoretic. No erythema. No pallor. CARDIOVASCULAR: Normal heart rate noted, regular rhythm, no murmur RESPIRATORY: Clear to auscultation bilaterally. Effort and breath sounds normal, no problems with respiration noted. BREASTS: Symmetric in size. No masses, skin changes, nipple drainage, or lymphadenopathy. ABDOMEN: Soft, normal bowel sounds, no distention noted.  No tenderness, rebound or guarding.  BLADDER: Normal PELVIC:  External Genitalia: Normal  BUS: Normal  Vagina: Mild atrophy; good vault support; milky white secretions in vault  Cervix: Surgically absent  Uterus: Surgically absent  Adnexa: Normal  Bimanual: No palpable masses or tenderness  RV: External Exam NormaI, No Rectal Masses and Normal Sphincter tone  MUSCULOSKELETAL: Normal range of motion. No tenderness.  No cyanosis, clubbing, or edema.  2+ distal pulses. LYMPHATIC: No Axillary, Supraclavicular, or Inguinal Adenopathy.  PROCEDURE: Wet prep Normal saline-TNTC white blood cells; no clue cells; no active trichomonas KOH-no yeast  Assessment:   Annual gynecologic examination 57 y.o. Contraception: status post hysterectomyTAH/BSO  Normal BMI Vaginal atrophy, less symptomatic with Intrarosa therapy Leukorrhea  Plan:  Pap: Not needed Mammogram: Ordered Stool Guaiac Testing:  Ordered Labs: thru pcp Routine preventative health maintenance measures emphasized: Exercise/Diet/Weight control, Tobacco Warnings and  Alcohol/Substance use risks  Nuswab plus to assess vaginitis etiology Zithromax 1 g orally Flagyl 500 mg twice a day for 7 days Continue Intrarosa suppositories daily Return to [place], CMA  Brayton Mars, MD   Note: This dictation was prepared with Dragon dictation along with smaller phrase technology. Any transcriptional errors that result from this process are unintentional.

## 2018-06-02 ENCOUNTER — Other Ambulatory Visit (HOSPITAL_COMMUNITY)
Admission: RE | Admit: 2018-06-02 | Discharge: 2018-06-02 | Disposition: A | Discharge status: Home / Self Care | Payer: 59 | Source: Ambulatory Visit | Attending: Obstetrics and Gynecology | Admitting: Obstetrics and Gynecology

## 2018-06-02 ENCOUNTER — Encounter: Payer: Self-pay | Admitting: Obstetrics and Gynecology

## 2018-06-02 ENCOUNTER — Ambulatory Visit (INDEPENDENT_AMBULATORY_CARE_PROVIDER_SITE_OTHER): Payer: 59 | Admitting: Obstetrics and Gynecology

## 2018-06-02 VITALS — BP 114/74 | HR 52 | Ht 68.0 in | Wt 141.6 lb

## 2018-06-02 DIAGNOSIS — N952 Postmenopausal atrophic vaginitis: Secondary | ICD-10-CM | POA: Diagnosis not present

## 2018-06-02 DIAGNOSIS — Z1211 Encounter for screening for malignant neoplasm of colon: Secondary | ICD-10-CM | POA: Diagnosis not present

## 2018-06-02 DIAGNOSIS — Z9079 Acquired absence of other genital organ(s): Secondary | ICD-10-CM

## 2018-06-02 DIAGNOSIS — Z90710 Acquired absence of both cervix and uterus: Secondary | ICD-10-CM

## 2018-06-02 DIAGNOSIS — Z1239 Encounter for other screening for malignant neoplasm of breast: Secondary | ICD-10-CM

## 2018-06-02 DIAGNOSIS — N941 Unspecified dyspareunia: Secondary | ICD-10-CM | POA: Diagnosis not present

## 2018-06-02 DIAGNOSIS — Z1231 Encounter for screening mammogram for malignant neoplasm of breast: Secondary | ICD-10-CM | POA: Diagnosis not present

## 2018-06-02 DIAGNOSIS — N898 Other specified noninflammatory disorders of vagina: Secondary | ICD-10-CM

## 2018-06-02 DIAGNOSIS — Z90722 Acquired absence of ovaries, bilateral: Secondary | ICD-10-CM | POA: Diagnosis not present

## 2018-06-02 DIAGNOSIS — Z01419 Encounter for gynecological examination (general) (routine) without abnormal findings: Secondary | ICD-10-CM | POA: Diagnosis not present

## 2018-06-02 MED ORDER — AZITHROMYCIN 1 G PO PACK: 1.0000 g | PACK | Freq: Once | ORAL | 0 refills | Status: AC

## 2018-06-02 MED ORDER — PRASTERONE 6.5 MG VA INST: 6.5000 mg | VAGINAL_INSERT | Freq: Every day | VAGINAL | 3 refills | Status: DC

## 2018-06-02 MED ORDER — METRONIDAZOLE 500 MG PO TABS: 500.0000 mg | ORAL_TABLET | Freq: Two times a day (BID) | ORAL | 0 refills | Status: AC

## 2018-06-02 NOTE — Patient Instructions (Signed)
1.  No Pap smear is done.  No further Pap smears are needed. 2.  Mammogram is ordered. 3.  Stool guaiac cards are given for colon cancer screening. 4.  Screening labs are to be obtained through primary care 5.  Continue with healthy eating and exercise. 6.  Continue with calcium and vitamin D supplementation twice a day 7.  Continue with Intrarosa suppositories intravaginal daily 8.  Return in 1 year for annual exam   Health Maintenance for Postmenopausal Women Menopause is a normal process in which your reproductive ability comes to an end. This process happens gradually over a span of months to years, usually between the ages of 66 and 66. Menopause is complete when you have missed 12 consecutive menstrual periods. It is important to talk with your health care provider about some of the most common conditions that affect postmenopausal women, such as heart disease, cancer, and bone loss (osteoporosis). Adopting a healthy lifestyle and getting preventive care can help to promote your health and wellness. Those actions can also lower your chances of developing some of these common conditions. What should I know about menopause? During menopause, you may experience a number of symptoms, such as:  Moderate-to-severe hot flashes.  Night sweats.  Decrease in sex drive.  Mood swings.  Headaches.  Tiredness.  Irritability.  Memory problems.  Insomnia.  Choosing to treat or not to treat menopausal changes is an individual decision that you make with your health care provider. What should I know about hormone replacement therapy and supplements? Hormone therapy products are effective for treating symptoms that are associated with menopause, such as hot flashes and night sweats. Hormone replacement carries certain risks, especially as you become older. If you are thinking about using estrogen or estrogen with progestin treatments, discuss the benefits and risks with your health care  provider. What should I know about heart disease and stroke? Heart disease, heart attack, and stroke become more likely as you age. This may be due, in part, to the hormonal changes that your body experiences during menopause. These can affect how your body processes dietary fats, triglycerides, and cholesterol. Heart attack and stroke are both medical emergencies. There are many things that you can do to help prevent heart disease and stroke:  Have your blood pressure checked at least every 1-2 years. High blood pressure causes heart disease and increases the risk of stroke.  If you are 63-43 years old, ask your health care provider if you should take aspirin to prevent a heart attack or a stroke.  Do not use any tobacco products, including cigarettes, chewing tobacco, or electronic cigarettes. If you need help quitting, ask your health care provider.  It is important to eat a healthy diet and maintain a healthy weight. ? Be sure to include plenty of vegetables, fruits, low-fat dairy products, and lean protein. ? Avoid eating foods that are high in solid fats, added sugars, or salt (sodium).  Get regular exercise. This is one of the most important things that you can do for your health. ? Try to exercise for at least 150 minutes each week. The type of exercise that you do should increase your heart rate and make you sweat. This is known as moderate-intensity exercise. ? Try to do strengthening exercises at least twice each week. Do these in addition to the moderate-intensity exercise.  Know your numbers.Ask your health care provider to check your cholesterol and your blood glucose. Continue to have your blood tested as directed  by your health care provider.  What should I know about cancer screening? There are several types of cancer. Take the following steps to reduce your risk and to catch any cancer development as early as possible. Breast Cancer  Practice breast self-awareness. ? This  means understanding how your breasts normally appear and feel. ? It also means doing regular breast self-exams. Let your health care provider know about any changes, no matter how small.  If you are 30 or older, have a clinician do a breast exam (clinical breast exam or CBE) every year. Depending on your age, family history, and medical history, it may be recommended that you also have a yearly breast X-ray (mammogram).  If you have a family history of breast cancer, talk with your health care provider about genetic screening.  If you are at high risk for breast cancer, talk with your health care provider about having an MRI and a mammogram every year.  Breast cancer (BRCA) gene test is recommended for women who have family members with BRCA-related cancers. Results of the assessment will determine the need for genetic counseling and BRCA1 and for BRCA2 testing. BRCA-related cancers include these types: ? Breast. This occurs in males or females. ? Ovarian. ? Tubal. This may also be called fallopian tube cancer. ? Cancer of the abdominal or pelvic lining (peritoneal cancer). ? Prostate. ? Pancreatic.  Cervical, Uterine, and Ovarian Cancer Your health care provider may recommend that you be screened regularly for cancer of the pelvic organs. These include your ovaries, uterus, and vagina. This screening involves a pelvic exam, which includes checking for microscopic changes to the surface of your cervix (Pap test).  For women ages 21-65, health care providers may recommend a pelvic exam and a Pap test every three years. For women ages 48-65, they may recommend the Pap test and pelvic exam, combined with testing for human papilloma virus (HPV), every five years. Some types of HPV increase your risk of cervical cancer. Testing for HPV may also be done on women of any age who have unclear Pap test results.  Other health care providers may not recommend any screening for nonpregnant women who are  considered low risk for pelvic cancer and have no symptoms. Ask your health care provider if a screening pelvic exam is right for you.  If you have had past treatment for cervical cancer or a condition that could lead to cancer, you need Pap tests and screening for cancer for at least 20 years after your treatment. If Pap tests have been discontinued for you, your risk factors (such as having a new sexual partner) need to be reassessed to determine if you should start having screenings again. Some women have medical problems that increase the chance of getting cervical cancer. In these cases, your health care provider may recommend that you have screening and Pap tests more often.  If you have a family history of uterine cancer or ovarian cancer, talk with your health care provider about genetic screening.  If you have vaginal bleeding after reaching menopause, tell your health care provider.  There are currently no reliable tests available to screen for ovarian cancer.  Lung Cancer Lung cancer screening is recommended for adults 37-103 years old who are at high risk for lung cancer because of a history of smoking. A yearly low-dose CT scan of the lungs is recommended if you:  Currently smoke.  Have a history of at least 30 pack-years of smoking and you currently smoke  or have quit within the past 15 years. A pack-year is smoking an average of one pack of cigarettes per day for one year.  Yearly screening should:  Continue until it has been 15 years since you quit.  Stop if you develop a health problem that would prevent you from having lung cancer treatment.  Colorectal Cancer  This type of cancer can be detected and can often be prevented.  Routine colorectal cancer screening usually begins at age 22 and continues through age 56.  If you have risk factors for colon cancer, your health care provider may recommend that you be screened at an earlier age.  If you have a family history of  colorectal cancer, talk with your health care provider about genetic screening.  Your health care provider may also recommend using home test kits to check for hidden blood in your stool.  A small camera at the end of a tube can be used to examine your colon directly (sigmoidoscopy or colonoscopy). This is done to check for the earliest forms of colorectal cancer.  Direct examination of the colon should be repeated every 5-10 years until age 73. However, if early forms of precancerous polyps or small growths are found or if you have a family history or genetic risk for colorectal cancer, you may need to be screened more often.  Skin Cancer  Check your skin from head to toe regularly.  Monitor any moles. Be sure to tell your health care provider: ? About any new moles or changes in moles, especially if there is a change in a mole's shape or color. ? If you have a mole that is larger than the size of a pencil eraser.  If any of your family members has a history of skin cancer, especially at a young age, talk with your health care provider about genetic screening.  Always use sunscreen. Apply sunscreen liberally and repeatedly throughout the day.  Whenever you are outside, protect yourself by wearing long sleeves, pants, a wide-brimmed hat, and sunglasses.  What should I know about osteoporosis? Osteoporosis is a condition in which bone destruction happens more quickly than new bone creation. After menopause, you may be at an increased risk for osteoporosis. To help prevent osteoporosis or the bone fractures that can happen because of osteoporosis, the following is recommended:  If you are 15-17 years old, get at least 1,000 mg of calcium and at least 600 mg of vitamin D per day.  If you are older than age 26 but younger than age 2, get at least 1,200 mg of calcium and at least 600 mg of vitamin D per day.  If you are older than age 49, get at least 1,200 mg of calcium and at least 800 mg  of vitamin D per day.  Smoking and excessive alcohol intake increase the risk of osteoporosis. Eat foods that are rich in calcium and vitamin D, and do weight-bearing exercises several times each week as directed by your health care provider. What should I know about how menopause affects my mental health? Depression may occur at any age, but it is more common as you become older. Common symptoms of depression include:  Low or sad mood.  Changes in sleep patterns.  Changes in appetite or eating patterns.  Feeling an overall lack of motivation or enjoyment of activities that you previously enjoyed.  Frequent crying spells.  Talk with your health care provider if you think that you are experiencing depression. What should I  know about immunizations? It is important that you get and maintain your immunizations. These include:  Tetanus, diphtheria, and pertussis (Tdap) booster vaccine.  Influenza every year before the flu season begins.  Pneumonia vaccine.  Shingles vaccine.  Your health care provider may also recommend other immunizations. This information is not intended to replace advice given to you by your health care provider. Make sure you discuss any questions you have with your health care provider. Document Released: 11/14/2005 Document Revised: 04/11/2016 Document Reviewed: 06/26/2015 Elsevier Interactive Patient Education  2018 [employer].

## 2018-06-04 LAB — CERVICOVAGINAL ANCILLARY ONLY
Bacterial vaginitis: NEGATIVE
Candida vaginitis: NEGATIVE
Chlamydia: NEGATIVE
Neisseria Gonorrhea: NEGATIVE
Trichomonas: NEGATIVE

## 2018-07-01 DIAGNOSIS — I471 Supraventricular tachycardia: Secondary | ICD-10-CM | POA: Diagnosis not present

## 2018-07-01 DIAGNOSIS — E7849 Other hyperlipidemia: Secondary | ICD-10-CM | POA: Diagnosis not present

## 2018-07-01 DIAGNOSIS — L409 Psoriasis, unspecified: Secondary | ICD-10-CM | POA: Diagnosis not present

## 2018-07-01 DIAGNOSIS — I10 Essential (primary) hypertension: Secondary | ICD-10-CM | POA: Diagnosis not present

## 2018-07-01 DIAGNOSIS — Z0000 Encounter for general adult medical examination without abnormal findings: Secondary | ICD-10-CM | POA: Diagnosis not present

## 2018-07-01 DIAGNOSIS — E038 Other specified hypothyroidism: Secondary | ICD-10-CM | POA: Diagnosis not present

## 2018-07-01 DIAGNOSIS — E063 Autoimmune thyroiditis: Secondary | ICD-10-CM | POA: Diagnosis not present

## 2018-08-19 DIAGNOSIS — E039 Hypothyroidism, unspecified: Secondary | ICD-10-CM | POA: Diagnosis not present

## 2018-12-27 ENCOUNTER — Ambulatory Visit: Payer: 59 | Admitting: Anesthesiology

## 2018-12-27 ENCOUNTER — Encounter
Admission: RE | Disposition: A | Discharge status: Home / Self Care | Payer: Self-pay | Source: Ambulatory Visit | Attending: Gastroenterology

## 2018-12-27 ENCOUNTER — Ambulatory Visit
Admission: RE | Admit: 2018-12-27 | Discharge: 2018-12-27 | Disposition: A | Discharge status: Home / Self Care | Payer: 59 | Source: Ambulatory Visit | Attending: Gastroenterology | Admitting: Gastroenterology

## 2018-12-27 DIAGNOSIS — R1310 Dysphagia, unspecified: Secondary | ICD-10-CM | POA: Diagnosis not present

## 2018-12-27 DIAGNOSIS — K219 Gastro-esophageal reflux disease without esophagitis: Secondary | ICD-10-CM | POA: Diagnosis not present

## 2018-12-27 DIAGNOSIS — T18128A Food in esophagus causing other injury, initial encounter: Secondary | ICD-10-CM | POA: Diagnosis not present

## 2018-12-27 DIAGNOSIS — G43909 Migraine, unspecified, not intractable, without status migrainosus: Secondary | ICD-10-CM | POA: Diagnosis not present

## 2018-12-27 DIAGNOSIS — Z79890 Hormone replacement therapy (postmenopausal): Secondary | ICD-10-CM | POA: Insufficient documentation

## 2018-12-27 DIAGNOSIS — Z79899 Other long term (current) drug therapy: Secondary | ICD-10-CM | POA: Insufficient documentation

## 2018-12-27 DIAGNOSIS — I10 Essential (primary) hypertension: Secondary | ICD-10-CM | POA: Insufficient documentation

## 2018-12-27 DIAGNOSIS — T18108A Unspecified foreign body in esophagus causing other injury, initial encounter: Secondary | ICD-10-CM | POA: Diagnosis not present

## 2018-12-27 DIAGNOSIS — E039 Hypothyroidism, unspecified: Secondary | ICD-10-CM | POA: Insufficient documentation

## 2018-12-27 DIAGNOSIS — T189XXA Foreign body of alimentary tract, part unspecified, initial encounter: Secondary | ICD-10-CM | POA: Diagnosis not present

## 2018-12-27 HISTORY — PX: ESOPHAGOGASTRODUODENOSCOPY (EGD) WITH PROPOFOL: SHX5813

## 2018-12-27 SURGERY — ESOPHAGOGASTRODUODENOSCOPY (EGD) WITH PROPOFOL
Anesthesia: General

## 2018-12-27 MED ORDER — ROCURONIUM BROMIDE 100 MG/10ML IV SOLN
INTRAVENOUS | Status: DC | PRN
  Administered 2018-12-27: 5 mg via INTRAVENOUS

## 2018-12-27 MED ORDER — LIDOCAINE HCL (CARDIAC) PF 100 MG/5ML IV SOSY
PREFILLED_SYRINGE | INTRAVENOUS | Status: DC | PRN
  Administered 2018-12-27: 60 mg via INTRAVENOUS

## 2018-12-27 MED ORDER — DEXAMETHASONE SODIUM PHOSPHATE 10 MG/ML IJ SOLN
INTRAMUSCULAR | Status: DC | PRN
  Administered 2018-12-27: 10 mg via INTRAVENOUS

## 2018-12-27 MED ORDER — FENTANYL CITRATE (PF) 100 MCG/2ML IJ SOLN
INTRAMUSCULAR | Status: DC | PRN
  Administered 2018-12-27 (×2): 50 ug via INTRAVENOUS

## 2018-12-27 MED ORDER — ROCURONIUM BROMIDE 50 MG/5ML IV SOLN
INTRAVENOUS | Status: AC
  Filled 2018-12-27: qty 1

## 2018-12-27 MED ORDER — DEXAMETHASONE SODIUM PHOSPHATE 4 MG/ML IJ SOLN
INTRAMUSCULAR | Status: AC
  Filled 2018-12-27: qty 2

## 2018-12-27 MED ORDER — MIDAZOLAM HCL 2 MG/2ML IJ SOLN
INTRAMUSCULAR | Status: AC
  Filled 2018-12-27: qty 2

## 2018-12-27 MED ORDER — PROPOFOL 10 MG/ML IV BOLUS
INTRAVENOUS | Status: DC | PRN
  Administered 2018-12-27: 150 mg via INTRAVENOUS

## 2018-12-27 MED ORDER — FENTANYL CITRATE (PF) 100 MCG/2ML IJ SOLN: 25.0000 ug | INTRAMUSCULAR | Status: DC | PRN

## 2018-12-27 MED ORDER — SODIUM CHLORIDE 0.9 % IV SOLN
INTRAVENOUS | Status: DC
  Administered 2018-12-27: 1000 mL via INTRAVENOUS

## 2018-12-27 MED ORDER — FENTANYL CITRATE (PF) 100 MCG/2ML IJ SOLN
INTRAMUSCULAR | Status: AC
  Filled 2018-12-27: qty 2

## 2018-12-27 MED ORDER — SUCCINYLCHOLINE CHLORIDE 20 MG/ML IJ SOLN
INTRAMUSCULAR | Status: DC | PRN
  Administered 2018-12-27: 120 mg via INTRAVENOUS

## 2018-12-27 MED ORDER — ONDANSETRON HCL 4 MG/2ML IJ SOLN
INTRAMUSCULAR | Status: AC
  Filled 2018-12-27: qty 2

## 2018-12-27 MED ORDER — ONDANSETRON HCL 4 MG/2ML IJ SOLN
INTRAMUSCULAR | Status: DC | PRN
  Administered 2018-12-27: 4 mg via INTRAVENOUS

## 2018-12-27 MED ORDER — MIDAZOLAM HCL 5 MG/5ML IJ SOLN
INTRAMUSCULAR | Status: DC | PRN
  Administered 2018-12-27: 2 mg via INTRAVENOUS

## 2018-12-27 MED ORDER — SUCCINYLCHOLINE CHLORIDE 20 MG/ML IJ SOLN
INTRAMUSCULAR | Status: AC
  Filled 2018-12-27: qty 1

## 2018-12-27 MED ORDER — PROPOFOL 10 MG/ML IV BOLUS
INTRAVENOUS | Status: AC
  Filled 2018-12-27: qty 20

## 2018-12-27 NOTE — Transfer of Care (Signed)
Immediate Anesthesia Transfer of Care Note  Patient: Sara Moss  Procedure(s) Performed: ESOPHAGOGASTRODUODENOSCOPY (EGD) WITH PROPOFOL (N/A )  Patient Location: PACU  Anesthesia Type:General  Level of Consciousness: sedated  Airway & Oxygen Therapy: Patient Spontanous Breathing and Patient connected to face mask oxygen  Post-op Assessment: Report given to RN and Post -op Vital signs reviewed and stable  Post vital signs: Reviewed and stable  Last Vitals:  Vitals Value Taken Time  BP 129/104 12/27/2018  1:32 PM  Temp    Pulse 92 12/27/2018  1:34 PM  Resp 21 12/27/2018  1:34 PM  SpO2 99 % 12/27/2018  1:34 PM  Vitals shown include unvalidated device data.  Last Pain:  Vitals:   12/27/18 1212  TempSrc: Tympanic  PainSc: 0-No pain         Complications: No apparent anesthesia complications

## 2018-12-27 NOTE — Op Note (Addendum)
[facility] Patient Name: Sara Moss Procedure Date: 12/27/2018 12:22 PM MRN: [id number] Account #: [id number] Date of Birth: May 11, 1961 Admit Type: Outpatient Age: 58 Room: [facility] ENDO ROOM 3 Gender: Female Note Status: Finalized Procedure:            Upper GI endoscopy Indications:          Foreign body in the esophagus Providers:            Jonathon Bellows MD, MD Referring MD:         Ramonita Lab, MD (Referring MD) Medicines:            General Anesthesia Complications:        No immediate complications. Procedure:            Pre-Anesthesia Assessment:                       - Prior to the procedure, a History and Physical was                        performed, and patient medications, allergies and                        sensitivities were reviewed. The patient's tolerance of                        previous anesthesia was reviewed.                       - The risks and benefits of the procedure and the                        sedation options and risks were discussed with the                        patient. All questions were answered and informed                        consent was obtained.                       - ASA Grade Assessment: II - A patient with mild                        systemic disease.                       After obtaining informed consent, the endoscope was                        passed under direct vision. Throughout the procedure,                        the patient's blood pressure, pulse, and oxygen                        saturations were monitored continuously. The Endoscope                        was introduced through the mouth, and advanced to the  third part of duodenum. The upper GI endoscopy was                        accomplished with ease. The patient tolerated the                        procedure well. Findings:      The stomach was normal.      The cardia and gastric fundus were normal on  retroflexion.      The examined duodenum was normal.      Food was found at the gastroesophageal junction. Removal was       accomplished with a Raptor grasping device. Biopsies were obtained from       the proximal and distal esophagus with cold forceps for histology of       suspected eosinophilic esophagitis. Impression:           - Normal stomach.                       - Normal examined duodenum.                       - Food was found in the esophagus. Removal was                        successful. Recommendation:       - Discharge patient to home (with escort).                       - Mechanical soft diet for 2 weeks.                       - Avoid steak,pork and only eat finely cut chicken or                        fish.                       Continue daily PPI                       Follow up with Gastroenterology to discuss biopsy                        results and next steps Procedure Code(s):    --- Professional ---                       [id number], Esophagogastroduodenoscopy, flexible, transoral;                        with removal of foreign body(s)                       43239, Esophagogastroduodenoscopy, flexible, transoral;                        with biopsy, single or multiple Diagnosis Code(s):    --- Professional ---                       Y85.027X, Food in esophagus causing other injury,  initial encounter                       T18.108A, Unspecified foreign body in esophagus causing                        other injury, initial encounter CPT copyright 2018 American Medical Association. All rights reserved. The codes documented in this report are preliminary and upon coder review may  be revised to meet current compliance requirements. Jonathon Bellows, MD Jonathon Bellows MD, MD 12/27/2018 1:23:12 PM This report has been signed electronically. Number of Addenda: 0 Note Initiated On: 12/27/2018 12:22 PM      [facility]

## 2018-12-27 NOTE — Anesthesia Preprocedure Evaluation (Addendum)
Anesthesia Evaluation  Patient identified by MRN, date of birth, ID band Patient awake    Reviewed: Allergy & Precautions, H&P , NPO status , Patient's Chart, lab work & pertinent test results  Airway Mallampati: I  TM Distance: >3 FB     Dental  (+) Teeth Intact   Pulmonary neg pulmonary ROS,           Cardiovascular hypertension,      Neuro/Psych  Headaches, negative psych ROS   GI/Hepatic Neg liver ROS, GERD  ,H/o dysphagia   Endo/Other  Hypothyroidism   Renal/GU      Musculoskeletal   Abdominal   Peds  Hematology negative hematology ROS (+)   Anesthesia Other Findings H/o dysphagia, now with food impaction  Past Medical History: No date: Arrhythmia No date: Cancer ([place]) No date: Dysphagia No date: GERD (gastroesophageal reflux disease) No date: Hx of skin cancer, basal cell No date: Hyperlipidemia No date: Hypertension No date: Hyperthyroidism with Hashimoto disease No date: Hypothyroidism No date: Migraines No date: Mitral valve prolapse No date: Other dysphagia No date: Periodic headache syndrome No date: Psoriasis No date: SVT (supraventricular tachycardia) (HCC)     Comment:  adenosine sensitive short RP SVT No date: Thyroid disease     Comment:  Hypothyroid No date: Varicose veins of legs No date: Varicose veins of legs No date: Vein compression  Past Surgical History: 02/05/2018: ESOPHAGOGASTRODUODENOSCOPY (EGD) WITH PROPOFOL; N/A     Comment:  Procedure: ESOPHAGOGASTRODUODENOSCOPY (EGD) WITH               PROPOFOL;  Surgeon: Manya Silvas, MD;  Location:               [facility] ENDOSCOPY;  Service: Endoscopy;  Laterality: N/A; No date: IR SCLEROTHERAPY OF A FLUID COLLECTION 2002: MELANOMA EXCISION No date: REFRACTIVE SURGERY 1999: TOTAL ABDOMINAL HYSTERECTOMY     Reproductive/Obstetrics negative OB ROS                           Anesthesia Physical Anesthesia  Plan  ASA: II  Anesthesia Plan: General ETT and Rapid Sequence   Post-op Pain Management:    Induction:   PONV Risk Score and Plan: Ondansetron, Dexamethasone, Midazolam and Treatment may vary due to age or medical condition  Airway Management Planned:   Additional Equipment:   Intra-op Plan:   Post-operative Plan:   Informed Consent: I have reviewed the patients History and Physical, chart, labs and discussed the procedure including the risks, benefits and alternatives for the proposed anesthesia with the patient or authorized representative who has indicated his/her understanding and acceptance.     Dental Advisory Given  Plan Discussed with: Anesthesiologist and CRNA  Anesthesia Plan Comments:        Anesthesia Quick Evaluation

## 2018-12-27 NOTE — Consult Note (Signed)
Sara Moss , MD [address] [place], [place], [place], [address] [address], [place], [place], [place], [address] Phone: [phone]  Fax: [phone]  Consultation  Referring Provider:    Oncology Primary Care Physician:  Adin Hector, MD Primary Gastroenterologist:  Dr. 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  She called me a short while back and informed me that since last night she has not been able to swallow.  This happened after she had some steak.  She recollects that she has had this occur in the past and has had endoscopy to remove the food bolus.  This morning she was unable to follow any of her tablets.  She says she suffers from reflux is on a PPI.  She denies being on any blood thinners.  She has suffered from SVT in the past which required adenosine.  She denies any shortness of breath cough upper respiratory symptoms fever or close contacts with any covid  positive patients at this time.  Past Medical History:  Diagnosis Date  . Arrhythmia   . Cancer ([place])   . Dysphagia   . GERD (gastroesophageal reflux disease)   . Hx of skin cancer, basal cell   . Hyperlipidemia   . Hypertension   . Hyperthyroidism with Hashimoto disease   . Hypothyroidism   . Migraines   . Mitral valve prolapse   . Other dysphagia   . Periodic headache syndrome   . Psoriasis   . SVT (supraventricular tachycardia) (HCC)    adenosine sensitive short RP SVT  . Thyroid disease    Hypothyroid  . Varicose veins of legs   . Varicose veins of legs   . Vein compression     Past Surgical History:  Procedure Laterality Date  . ESOPHAGOGASTRODUODENOSCOPY (EGD) WITH PROPOFOL N/A 02/05/2018   Procedure: ESOPHAGOGASTRODUODENOSCOPY (EGD) WITH PROPOFOL;  Surgeon: Manya Silvas, MD;  Location: [facility]  ENDOSCOPY;  Service: Endoscopy;  Laterality: N/A;  . IR SCLEROTHERAPY OF A FLUID COLLECTION    . MELANOMA EXCISION  2002  . REFRACTIVE SURGERY    . TOTAL ABDOMINAL HYSTERECTOMY  1999    Prior to Admission medications   Medication Sig Start Date End Date Taking? Authorizing Provider  almotriptan (AXERT) 12.5 MG tablet Take 12.5 mg by mouth as needed for migraine (As directed).  08/19/16   [provider]  ALPRAZolam Duanne Moron) 0.25 MG tablet Take 0.25 mg by mouth as needed (As directed).  05/12/16   [provider]  cyclobenzaprine (FLEXERIL) 10 MG tablet Take 1 tablet (10 mg total) by mouth every 8 (eight) hours as needed for muscle spasms. 09/02/17   Caren Macadam, MD  lisinopril (PRINIVIL,ZESTRIL) 10 MG tablet Take 10 mg by mouth daily.    [provider]  metoprolol succinate (TOPROL-XL) 25 MG 24 hr tablet Take 25 mg by mouth daily. 06/18/17 06/18/18  [provider]  metroNIDAZOLE (FLAGYL) 500 MG tablet Take 1 tablet (500 mg total) by mouth 2 (two) times daily. 06/02/18   Defrancesco, Alanda Slim, MD  Prasterone (INTRAROSA) 6.5 MG INST Place 6.5 mg vaginally at bedtime. 06/02/18   Defrancesco, Alanda Slim, MD  promethazine (PHENERGAN) 25  MG tablet Take 1 tablet (25 mg total) by mouth every 6 (six) hours as needed for nausea or vomiting. 10/24/16   Jaclyn Prime, Collene Leyden, PA-C  SUMAtriptan (IMITREX) 100 MG tablet Take 1 tablet (100 mg total) by mouth once as needed for migraine. May repeat in 2 hours if headache persists or recurs. 12/05/16   Teague Carlis Abbott, Collene Leyden, PA-C  SYNTHROID 100 MCG tablet Take 100 mcg by mouth daily. 10/02/16   [provider]    Family History  Problem Relation Age of Onset  . Cancer Father        esophageal  . Cancer Mother        cervical  . Ovarian cancer Neg Hx   . Breast cancer Neg Hx   . Colon cancer Neg Hx   . Diabetes Neg Hx   . Heart disease Neg Hx      Social History   Tobacco Use  . Smoking status: Never  Smoker  . Smokeless tobacco: Never Used  Substance Use Topics  . Alcohol use: Yes    Comment: rare  . Drug use: No    Allergies as of 12/27/2018  . (No Known Allergies)    Review of Systems:    All systems reviewed and negative except where noted in HPI.   Physical Exam:  Vital signs in last 24 hours: BP: ()/()  Arterial Line BP: ()/()    General:   Pleasant, cooperative in NAD Head:  Normocephalic and atraumatic. Eyes:   No icterus.   Conjunctiva pink. PERRLA. Ears:  Normal auditory acuity. Neck:  Supple; no masses or thyroidomegaly Lungs: Respirations even and unlabored. Lungs clear to auscultation bilaterally.   No wheezes, crackles, or rhonchi.  Heart:  Regular rate and rhythm;  Without murmur, clicks, rubs or gallops Abdomen:  Soft, nondistended, nontender. Normal bowel sounds. No appreciable masses or hepatomegaly.  No rebound or guarding.  Neurologic:  Alert and oriented x3;  grossly normal neurologically. Skin:  Intact without significant lesions or rashes. Cervical Nodes:  No significant cervical adenopathy. Psych:  Alert and cooperative. Normal affect.  LAB RESULTS: No results for input(s): WBC, HGB, HCT, PLT in the last 72 hours. BMET No results for input(s): NA, K, CL, CO2, GLUCOSE, BUN, CREATININE, CALCIUM in the last 72 hours. LFT No results for input(s): PROT, ALBUMIN, AST, ALT, ALKPHOS, BILITOT, BILIDIR, IBILI in the last 72 hours. PT/INR No results for input(s): LABPROT, INR in the last 72 hours.  STUDIES: No results found.    Impression / Plan:   Sara Moss is a 58 y.o. y/o female with with acute dysphagia secondary to food bolus.  We will perform an upper endoscopy and rule out eosinophilic esophagitis with biopsies of the esophagus.  I have discussed alternative options, risks & benefits,  which include, but are not limited to, bleeding, infection, perforation,respiratory complication & drug reaction.  The patient agrees with this plan &  written consent will be obtained.     Thank you for involving me in the care of this patient.      LOS: 0 days   Sara Bellows, MD  12/27/2018, 11:42 AM

## 2018-12-27 NOTE — Anesthesia Post-op Follow-up Note (Signed)
Anesthesia QCDR form completed.        

## 2018-12-27 NOTE — Anesthesia Procedure Notes (Signed)
Procedure Name: Intubation Date/Time: 12/27/2018 12:53 PM Performed by: Dionne Bucy, CRNA Pre-anesthesia Checklist: Patient identified, Patient being monitored, Timeout performed, Emergency Drugs available and Suction available Patient Re-evaluated:Patient Re-evaluated prior to induction Oxygen Delivery Method: Circle system utilized Preoxygenation: Pre-oxygenation with 100% oxygen Induction Type: IV induction, Rapid sequence and Cricoid Pressure applied Ventilation: Mask ventilation without difficulty Laryngoscope Size: Mac and 3 Grade View: Grade I Tube type: Oral Tube size: 7.0 mm Number of attempts: 1 Airway Equipment and Method: Stylet Placement Confirmation: ETT inserted through vocal cords under direct vision,  positive ETCO2 and breath sounds checked- equal and bilateral Secured at: 22 cm Tube secured with: Tape Dental Injury: Teeth and Oropharynx as per pre-operative assessment

## 2018-12-27 NOTE — H&P (Signed)
Sara Bellows, MD [address], [place], [place], [place], [address] [address], [place], [place], [place], [address] Phone: [phone]  Fax: [phone]  Primary Care Physician:  Adin Hector, MD   Pre-Procedure History & Physical: HPI:  Sara Moss is a 58 y.o. female is here for an endoscopy    Past Medical History:  Diagnosis Date  . Arrhythmia   . Cancer ([place])   . Dysphagia   . GERD (gastroesophageal reflux disease)   . Hx of skin cancer, basal cell   . Hyperlipidemia   . Hypertension   . Hyperthyroidism with Hashimoto disease   . Hypothyroidism   . Migraines   . Mitral valve prolapse   . Other dysphagia   . Periodic headache syndrome   . Psoriasis   . SVT (supraventricular tachycardia) (HCC)    adenosine sensitive short RP SVT  . Thyroid disease    Hypothyroid  . Varicose veins of legs   . Varicose veins of legs   . Vein compression     Past Surgical History:  Procedure Laterality Date  . ESOPHAGOGASTRODUODENOSCOPY (EGD) WITH PROPOFOL N/A 02/05/2018   Procedure: ESOPHAGOGASTRODUODENOSCOPY (EGD) WITH PROPOFOL;  Surgeon: Manya Silvas, MD;  Location: [facility] ENDOSCOPY;  Service: Endoscopy;  Laterality: N/A;  . IR SCLEROTHERAPY OF A FLUID COLLECTION    . MELANOMA EXCISION  2002  . REFRACTIVE SURGERY    . TOTAL ABDOMINAL HYSTERECTOMY  1999    Prior to Admission medications   Medication Sig Start Date End Date Taking? Authorizing Provider  almotriptan (AXERT) 12.5 MG tablet Take 12.5 mg by mouth as needed for migraine (As directed).  08/19/16  Yes [provider]  ALPRAZolam (XANAX) 0.25 MG tablet Take 0.25 mg by mouth as needed (As directed).  05/12/16  Yes [provider]  cyclobenzaprine (FLEXERIL) 10 MG tablet Take 1 tablet (10 mg total) by mouth every 8 (eight) hours as needed for muscle spasms. 09/02/17  Yes Caren Macadam, MD  lisinopril (PRINIVIL,ZESTRIL) 10 MG tablet Take 10 mg by mouth daily.   Yes [provider]  metroNIDAZOLE (FLAGYL) 500 MG tablet Take 1 tablet (500 mg total) by mouth 2 (two) times daily. 06/02/18  Yes Defrancesco, Alanda Slim, MD  Prasterone (INTRAROSA) 6.5 MG INST Place 6.5 mg vaginally at bedtime. 06/02/18  Yes Defrancesco, Alanda Slim, MD  promethazine (PHENERGAN) 25 MG tablet Take 1 tablet (25 mg total) by mouth every 6 (six) hours as needed for nausea or vomiting. 10/24/16  Yes Teague Bobbye Morton, PA-C  SUMAtriptan (IMITREX) 100 MG tablet Take 1 tablet (100 mg total) by mouth once as needed for migraine. May repeat in 2 hours if headache persists or recurs. 12/05/16  Yes Teague Bobbye Morton, PA-C  SYNTHROID 100 MCG tablet Take 100 mcg by mouth daily. 10/02/16  Yes [provider]  metoprolol succinate (TOPROL-XL) 25 MG 24 hr tablet Take 25 mg by mouth daily. 06/18/17 06/18/18  [provider]    Allergies as of 12/27/2018  . (No Known Allergies)    Family History  Problem Relation Age of Onset  . Cancer Father        esophageal  . Cancer Mother        cervical  . Ovarian cancer Neg Hx   . Breast cancer Neg Hx   . Colon cancer Neg Hx   . Diabetes Neg Hx   . Heart disease Neg Hx  Social History   Socioeconomic History  . Marital status: Married    Spouse name: Not on file  . Number of children: Not on file  . Years of education: Not on file  . Highest education level: Not on file  Occupational History  . Not on file  Social Needs  . Financial resource strain: Not on file  . Food insecurity:    Worry: Not on file    Inability: Not on file  . Transportation needs:    Medical: Not on file    Non-medical: Not on file  Tobacco Use  . Smoking status: Never Smoker  . Smokeless tobacco: Never Used  Substance and Sexual Activity  . Alcohol use: Yes    Comment: rare  . Drug use: No  . Sexual activity: Yes    Birth control/protection: Surgical  Lifestyle  . Physical activity:    Days per week: 3 days    Minutes per session: 30  min  . Stress: Not on file  Relationships  . Social connections:    Talks on phone: Not on file    Gets together: Not on file    Attends religious service: Not on file    Active member of club or organization: Not on file    Attends meetings of clubs or organizations: Not on file    Relationship status: Not on file  . Intimate partner violence:    Fear of current or ex partner: Not on file    Emotionally abused: Not on file    Physically abused: Not on file    Forced sexual activity: Not on file  Other Topics Concern  . 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 Neck:  Supple; no masses or thyromegaly. Lungs:  Clear throughout to auscultation, normal respiratory effort.    Heart:  +S1, +S2, Regular rate and rhythm, No edema. Abdomen:  Soft, nontender and nondistended. Normal bowel sounds, without guarding, and without rebound.   Neurologic:  Alert and  oriented x4;  grossly normal neurologically.  Impression/Plan: Sara Moss is here for an endoscopy  to be performed for  evaluation of food impaction    Risks, benefits, limitations, and alternatives regarding endoscopy have been reviewed with the patient.  Questions have been answered.  All parties agreeable.   Sara Bellows, MD  12/27/2018, 12:35 PM

## 2018-12-28 LAB — SURGICAL PATHOLOGY

## 2018-12-29 ENCOUNTER — Encounter: Payer: Self-pay | Admitting: Gastroenterology

## 2018-12-29 ENCOUNTER — Telehealth: Payer: Self-pay

## 2018-12-29 NOTE — Progress Notes (Signed)
Inform biopsies show features of acid reflux. Suggest her to follow up with DR Tiffany Kocher who is her primary GI. May want to consider evaluation for achalasia as no clear stricture was seen .

## 2018-12-29 NOTE — Anesthesia Postprocedure Evaluation (Signed)
Anesthesia Post Note  Patient: Sara Moss  Procedure(s) Performed: ESOPHAGOGASTRODUODENOSCOPY (EGD) WITH PROPOFOL (N/A )  Patient location during evaluation: PACU Anesthesia Type: General Level of consciousness: awake and alert Pain management: pain level controlled Vital Signs Assessment: post-procedure vital signs reviewed and stable Respiratory status: spontaneous breathing, nonlabored ventilation and respiratory function stable Cardiovascular status: blood pressure returned to baseline and stable Postop Assessment: no apparent nausea or vomiting Anesthetic complications: no     Last Vitals:  Vitals:   12/27/18 1402 12/27/18 1417  BP: 128/78 120/68  Pulse: 89 80  Resp: (!) 24 (!) 6  Temp:    SpO2: 99% 94%    Last Pain:  Vitals:   12/28/18 1029  TempSrc:   PainSc: 0-No pain                 Durenda Hurt

## 2018-12-29 NOTE — Telephone Encounter (Signed)
Called pt to inform her of biopsy results and Dr. Georgeann Oppenheim suggestions.  Unable to contact, LVM to return call

## 2018-12-29 NOTE — Telephone Encounter (Signed)
-----   Message from Jonathon Bellows, MD sent at 12/29/2018  2:39 PM EDT ----- Inform biopsies show features of acid reflux. Suggest her to follow up with DR Tiffany Kocher who is her primary GI. May want to consider evaluation for achalasia as no clear stricture was seen .

## 2018-12-30 NOTE — Telephone Encounter (Signed)
Result notes have been sent via Mychart.

## 2018-12-30 NOTE — Telephone Encounter (Signed)
Called pt inform her of biopsy results  Unable to contact, LVM to return call

## 2018-12-30 NOTE — Telephone Encounter (Signed)
Patient returned Jadijah's call about results. She stated that she has seen them on My Chart. However if calling with more information to please leave a message because she is at work and is not able to get your call.

## 2018-12-30 NOTE — Telephone Encounter (Signed)
PT LEFT VM FOR [facility] REGARDING HER RESULTS

## 2018-12-31 ENCOUNTER — Encounter: Payer: Self-pay | Admitting: Gastroenterology

## 2019-04-27 DIAGNOSIS — Z03818 Encounter for observation for suspected exposure to other biological agents ruled out: Secondary | ICD-10-CM | POA: Diagnosis not present

## 2019-07-05 ENCOUNTER — Telehealth: Payer: Self-pay

## 2019-07-05 NOTE — Telephone Encounter (Signed)
Pt called no answer LM via VM to call the office to schedule an appointment with [facility] or [facility] to get refills of medication. Pt was advised to please call the office to schedule an appointment as soon as possible.

## 2019-07-06 ENCOUNTER — Telehealth: Payer: Self-pay | Admitting: Obstetrics and Gynecology

## 2019-07-06 NOTE — Telephone Encounter (Signed)
The patient called and is requesting a refill of the mediation Prasterone (INTRAROSA) 6.5 MG INST [id number] to get the patient through until her scheduled appointment with Dr. Amalia Hailey. Please advise.

## 2019-07-06 NOTE — Telephone Encounter (Signed)
OK to refill until appointment on 08/03/2019

## 2019-07-07 MED ORDER — INTRAROSA 6.5 MG VA INST: 6.5000 mg | VAGINAL_INSERT | Freq: Every day | VAGINAL | 0 refills | Status: AC

## 2019-07-07 NOTE — Telephone Encounter (Signed)
Prescription sent to pharmacy. Will call patient to let her know.

## 2019-07-08 NOTE — Telephone Encounter (Signed)
LM for patient that prescription is at pharmacy.

## 2019-08-03 ENCOUNTER — Encounter: Payer: 59 | Admitting: Obstetrics and Gynecology

## 2019-08-03 DIAGNOSIS — I471 Supraventricular tachycardia: Secondary | ICD-10-CM | POA: Diagnosis not present

## 2019-08-03 DIAGNOSIS — L409 Psoriasis, unspecified: Secondary | ICD-10-CM | POA: Diagnosis not present

## 2019-08-03 DIAGNOSIS — Z0000 Encounter for general adult medical examination without abnormal findings: Secondary | ICD-10-CM | POA: Diagnosis not present

## 2019-08-03 DIAGNOSIS — E7849 Other hyperlipidemia: Secondary | ICD-10-CM | POA: Diagnosis not present

## 2019-08-03 DIAGNOSIS — E038 Other specified hypothyroidism: Secondary | ICD-10-CM | POA: Diagnosis not present

## 2019-08-03 DIAGNOSIS — I10 Essential (primary) hypertension: Secondary | ICD-10-CM | POA: Diagnosis not present

## 2019-09-07 ENCOUNTER — Telehealth: Payer: Self-pay | Admitting: Gastroenterology

## 2019-09-07 NOTE — Telephone Encounter (Signed)
Pt returned call and agreed to schedule a televisit with Dr. Vicente Males for Dysphagia.

## 2019-09-07 NOTE — Telephone Encounter (Signed)
Called pt regarding her request to schedule an appointment with Dr. Vicente Males.  Unable to contact, LVM to return call

## 2019-09-07 NOTE — Telephone Encounter (Signed)
Pt left vm for Jadijah regarding an apt with Dr. Vicente Males

## 2019-09-08 ENCOUNTER — Ambulatory Visit (INDEPENDENT_AMBULATORY_CARE_PROVIDER_SITE_OTHER): Payer: 59 | Admitting: Gastroenterology

## 2019-09-08 DIAGNOSIS — R1310 Dysphagia, unspecified: Secondary | ICD-10-CM

## 2019-09-08 NOTE — Progress Notes (Signed)
Jonathon Bellows , MD [address]  [place]  [place], [place] [address]  Main: [phone]  Fax: [phone]   Primary Care Physician: Adin Hector, MD  Virtual Visit via Telephone Note  I connected with patient on 09/08/19 at  2:45 PM EST by telephone and verified that I am speaking with the correct person using two identifiers.   I discussed the limitations, risks, security and privacy concerns of performing an evaluation and management service by telephone and the availability of in person appointments. I also discussed with the patient that there may be a patient responsible charge related to this service. The patient expressed understanding and agreed to proceed.  Location of Patient: Home Location of Provider: Home Persons involved: Patient and provider only   History of Present Illness:   Follow-up for dysphagia  HPI: AYALA SANTELL is a 58 y.o. female   She was admitted to the hospital on 12/27/2018 with food impaction.  History of acid reflux.  He was to follow-up with Dr. Vira Agar in the past.  I performed an EGD and took out a piece of food from her esophagus.  Increased tone at the GE junction.  Biopsies esophagus showed no evidence of eosinophilia.  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  She recollects she has had dysphagia for solids more than 5 years.  She has tried PPI in the past but it does not help with the dysphagia but rather has help with the reflux.  She recollects she has been stretched in the past x2.  The first time it helped but the second time it did not.  She has felt at times that solids when it goes down get stuck and at times she has felt like a collection of fluid in her esophagus as well.  Current Outpatient Medications  Medication Sig Dispense Refill  . almotriptan (AXERT) 12.5 MG tablet Take 12.5 mg by mouth as needed for migraine (As directed).     . ALPRAZolam (XANAX) 0.25 MG tablet Take 0.25  mg by mouth as needed (As directed).     . cyclobenzaprine (FLEXERIL) 10 MG tablet Take 1 tablet (10 mg total) by mouth every 8 (eight) hours as needed for muscle spasms. 30 tablet 1  . lisinopril (PRINIVIL,ZESTRIL) 10 MG tablet Take 10 mg by mouth daily.    . metoprolol succinate (TOPROL-XL) 25 MG 24 hr tablet Take 25 mg by mouth daily.    . metroNIDAZOLE (FLAGYL) 500 MG tablet Take 1 tablet (500 mg total) by mouth 2 (two) times daily. 14 tablet 0  . Prasterone (INTRAROSA) 6.5 MG INST Place 6.5 mg vaginally at bedtime. 90 each 0  . promethazine (PHENERGAN) 25 MG tablet Take 1 tablet (25 mg total) by mouth every 6 (six) hours as needed for nausea or vomiting. 30 tablet 0  . SUMAtriptan (IMITREX) 100 MG tablet Take 1 tablet (100 mg total) by mouth once as needed for migraine. May repeat in 2 hours if headache persists or recurs. 9 tablet 2  . SYNTHROID 100 MCG tablet Take 100 mcg by mouth daily.  3   No current facility-administered medications for this visit.     Allergies as of 09/08/2019  . (No Known Allergies)    Review of Systems:    All systems reviewed and negative except where noted in HPI.   Observations/Objective:  Labs: CMP     Component Value Date/Time   NA  136 02/09/2013 0802   K 3.8 02/16/2015 0838   K 3.7 02/09/2013 0802   CL 100 02/09/2013 0802   CO2 31 02/09/2013 0802   GLUCOSE 86 02/09/2013 0802   BUN 18 02/16/2015 0838   BUN 18 02/09/2013 0802   CREATININE 0.91 02/16/2015 0838   CREATININE 0.90 02/09/2013 0802   CALCIUM 9.4 02/09/2013 0802   ALT 24 02/16/2015 0838   GFRNONAA >60 02/16/2015 0838   GFRNONAA >60 02/09/2013 0802   GFRAA >60 02/16/2015 0838   GFRAA >60 02/09/2013 0802   Lab Results  Component Value Date   WBC 5.5 02/09/2013   HGB 12.6 02/09/2013   HCT 37.6 02/09/2013   MCV 87 02/09/2013   PLT 224 02/09/2013    Imaging Studies: No results found.  Assessment and Plan:   YELONDA ASSEFA is a 58 y.o. y/o female here to follow-up for  dysphagia.  EGD in March 2020 for food impaction.  No evidence of eosinophilic esophagitis.  Increased LES tone was noted.  Suspicious for achalasia.  Plan :   1.  Obtain barium swallow with tablet.  Based on the results we will discuss about esophageal manometry   I discussed the assessment and treatment plan with the patient. The patient was provided an opportunity to ask questions and all were answered. The patient agreed with the plan and demonstrated an understanding of the instructions.   The patient was advised to call back or seek an in-person evaluation if the symptoms worsen or if the condition fails to improve as anticipated.  I provided 12 minutes of non-face-to-face time during this encounter.  Dr Jonathon Bellows MD,MRCP [facility]) Gastroenterology/Hepatology Pager: [phone]   Speech recognition software was used to dictate this note.

## 2019-09-09 ENCOUNTER — Telehealth: Payer: Self-pay

## 2019-09-09 ENCOUNTER — Other Ambulatory Visit: Payer: Self-pay

## 2019-09-09 DIAGNOSIS — R1310 Dysphagia, unspecified: Secondary | ICD-10-CM

## 2019-09-09 NOTE — Telephone Encounter (Signed)
Spoke with pt and was able to schedule her barium swallow appointment. Pt is aware of appointment information.

## 2019-09-09 NOTE — Telephone Encounter (Signed)
Called pt to discuss scheduling her barium swallow appointment as planned by Dr. Vicente Males.  Unable to contact, LVM to return call

## 2019-09-20 ENCOUNTER — Other Ambulatory Visit: Payer: Self-pay | Admitting: Gastroenterology

## 2019-09-20 ENCOUNTER — Ambulatory Visit
Admission: RE | Admit: 2019-09-20 | Discharge: 2019-09-20 | Disposition: A | Discharge status: Home / Self Care | Payer: 59 | Source: Ambulatory Visit | Attending: Gastroenterology | Admitting: Gastroenterology

## 2019-09-20 ENCOUNTER — Other Ambulatory Visit: Payer: Self-pay

## 2019-09-20 DIAGNOSIS — R1310 Dysphagia, unspecified: Secondary | ICD-10-CM

## 2019-09-20 DIAGNOSIS — K219 Gastro-esophageal reflux disease without esophagitis: Secondary | ICD-10-CM | POA: Diagnosis not present

## 2019-10-09 ENCOUNTER — Encounter: Payer: Self-pay | Admitting: Gastroenterology

## 2019-10-13 ENCOUNTER — Telehealth: Payer: Self-pay

## 2019-10-13 ENCOUNTER — Other Ambulatory Visit: Payer: Self-pay

## 2019-10-13 MED ORDER — OMEPRAZOLE 40 MG PO CPDR: 40.0000 mg | DELAYED_RELEASE_CAPSULE | Freq: Two times a day (BID) | ORAL | 5 refills | Status: AC

## 2019-10-13 NOTE — Telephone Encounter (Signed)
Spoke with pt and informed her of barium swallow result and Dr. Georgeann Oppenheim recommendations. Pt agrees to commence on Prilosec.

## 2019-10-13 NOTE — Telephone Encounter (Signed)
-----   Message from Jonathon Bellows, MD sent at 10/09/2019 10:35 AM EST ----- Inform the barium shows moderate GERD, enquire what dose of PPI she is taking and ensure she is on Prilosec 40 mg BID and inform her to let you know in 3-4 weeks if swallowing is better

## 2020-03-12 ENCOUNTER — Other Ambulatory Visit: Payer: Self-pay | Admitting: Internal Medicine

## 2020-05-04 ENCOUNTER — Ambulatory Visit: Payer: 59 | Attending: Internal Medicine

## 2020-05-04 DIAGNOSIS — Z23 Encounter for immunization: Secondary | ICD-10-CM

## 2020-05-04 NOTE — Progress Notes (Signed)
   Covid-[facility] Vaccination [facility]  Name:  MARLETA LAPIERRE    MRN: [id number] DOB: November 27, 1960  05/04/2020  Ms. Fonseca was observed post Covid-19 immunization for 15 minutes without incident. She was provided with Vaccine Information Sheet and instruction to access the V-Safe system.   Ms. Bessey was instructed to call 911 with any severe reactions post vaccine: Marland Kitchen Difficulty breathing  . Swelling of face and throat  . A fast heartbeat  . A bad rash all over body  . Dizziness and weakness   Immunizations Administered    Name Date Dose VIS Date Route   Pfizer COVID-19 Vaccine 05/04/2020  9:09 AM 0.3 mL 11/30/2018 Intramuscular   Manufacturer: [place]   Lot: [id number]   [place]: [id number]

## 2020-05-28 ENCOUNTER — Ambulatory Visit: Payer: 59 | Attending: Internal Medicine

## 2020-05-28 ENCOUNTER — Ambulatory Visit: Payer: 59

## 2020-05-28 DIAGNOSIS — Z23 Encounter for immunization: Secondary | ICD-10-CM

## 2020-05-28 NOTE — Progress Notes (Signed)
   Covid-19 Vaccination Clinic  Name:  Sara Moss    MRN: [id number] DOB: 1961/09/01  05/28/2020  [name] was observed post Covid-19 immunization for 15 minutes without incident. She was provided with Vaccine Information Sheet and instruction to access the V-Safe system.   [name] was instructed to call 911 with any severe reactions post vaccine: Marland Kitchen Difficulty breathing  . Swelling of face and throat  . A fast heartbeat  . A bad rash all over body  . Dizziness and weakness   Immunizations Administered    Name Date Dose VIS Date Route   Pfizer COVID-19 Vaccine 05/28/2020  2:49 PM 0.3 mL 11/30/2018 Intramuscular   Manufacturer: [place]   Lot: [id number]   [place]: [id number]

## 2020-07-01 IMAGING — RF DG ESOPHAGUS
8 of 10 series · 14 of 21 positions shown · non-contrast
Comparison: None.

CLINICAL DATA: Dysphagia with solids. Prior esophageal stretching,
twice.

EXAM:
ESOPHOGRAM / BARIUM SWALLOW / BARIUM TABLET STUDY
TECHNIQUE: Combined double contrast and single contrast examination performed
using effervescent crystals, thick barium liquid, and thin barium
liquid. The patient was observed with fluoroscopy swallowing a 13 mm
barium sulphate tablet.
FLUOROSCOPY TIME:  Fluoroscopy Time:  24 seconds
Radiation Exposure Index (if provided by the fluoroscopic device):
1.2 mGy
Number of Acquired Spot Images: 0

[Series 1: cp_standard · 0.26mm/px · 3 of 16 frames shown (1 of 8)]
[frame 3/16]
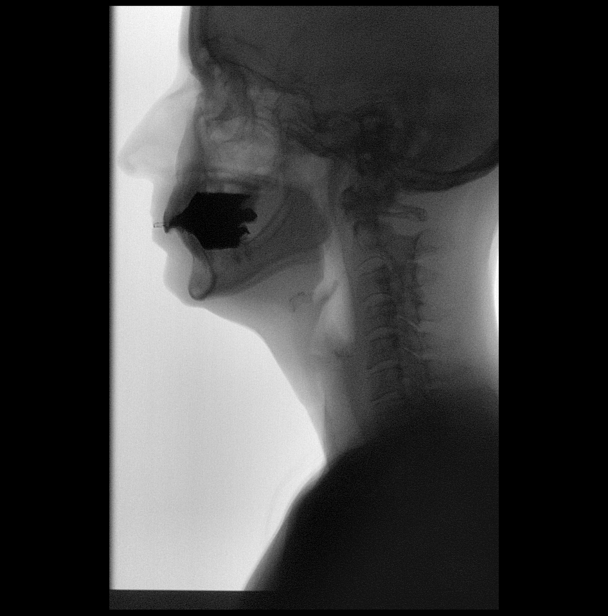
[frame 9/16]
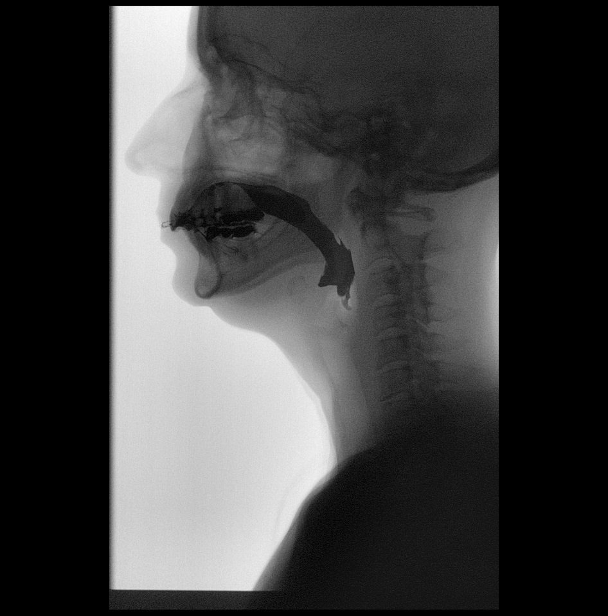
[frame 14/16]
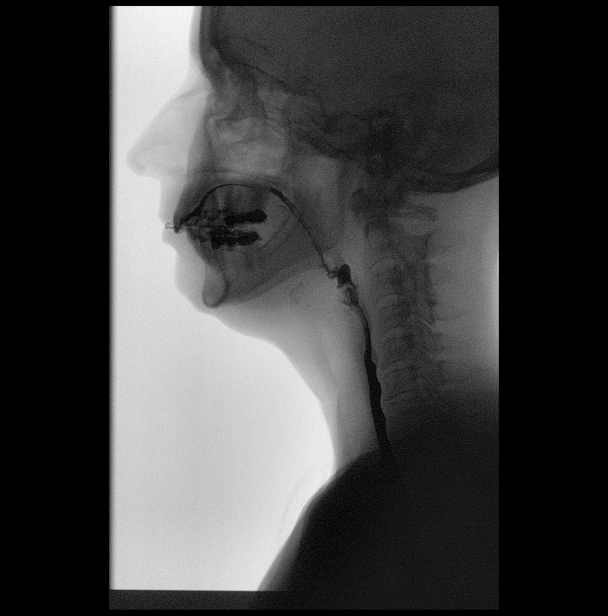

[Series 2: cp_standard · 0.26mm/px · 2 of 15 frames shown (2 of 8)]
[frame 8/15]
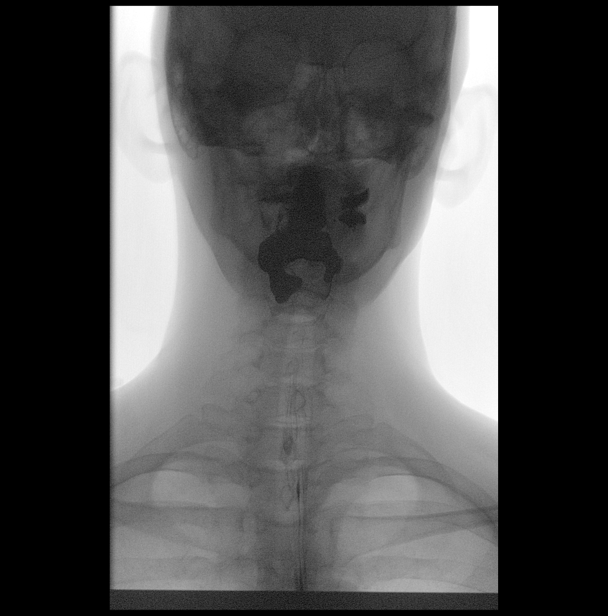
[frame 13/15]
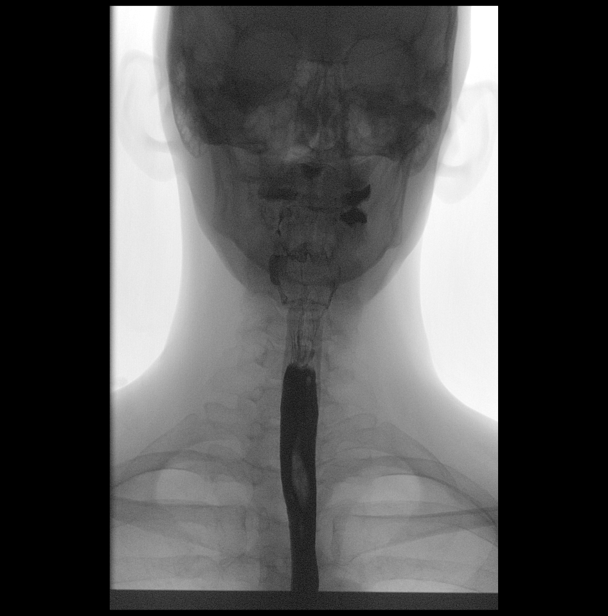

[Series 3: cp_standard · 0.26mm/px · 1 of 1 slices shown (3 of 8)]
[im 1/1]
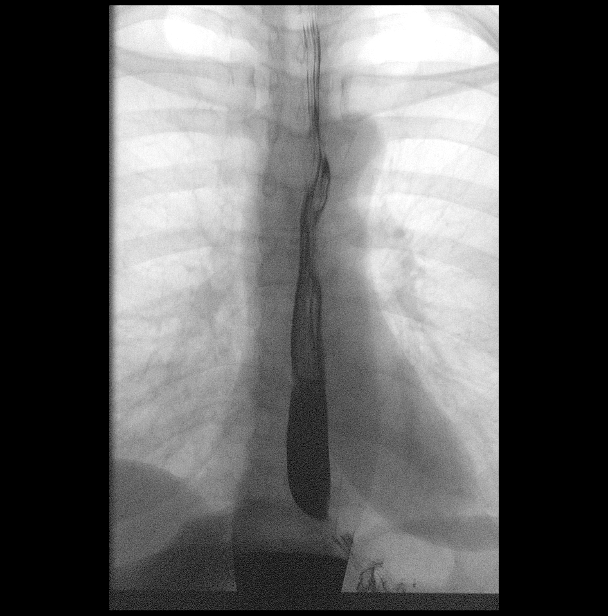

[Series 4: cp_standard · 0.26mm/px · 1 of 1 slices shown (4 of 8)]
[im 1/1]
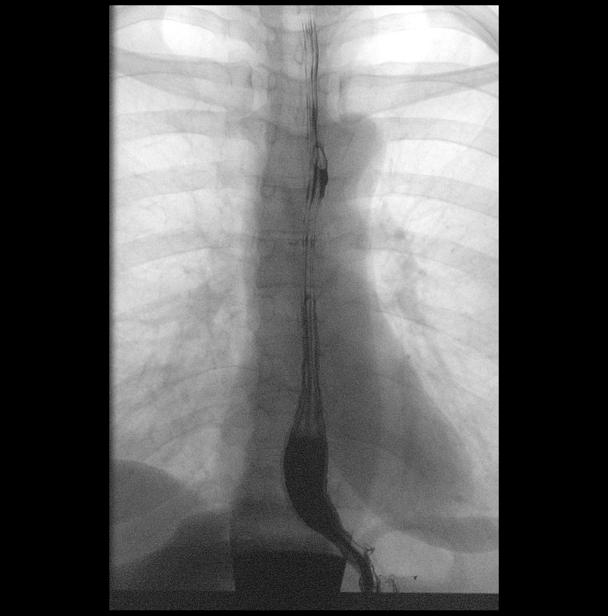

[Series 5: cp_standard · 0.25mm/px · 2 of 54 frames shown (5 of 8)]
[frame 28/54]
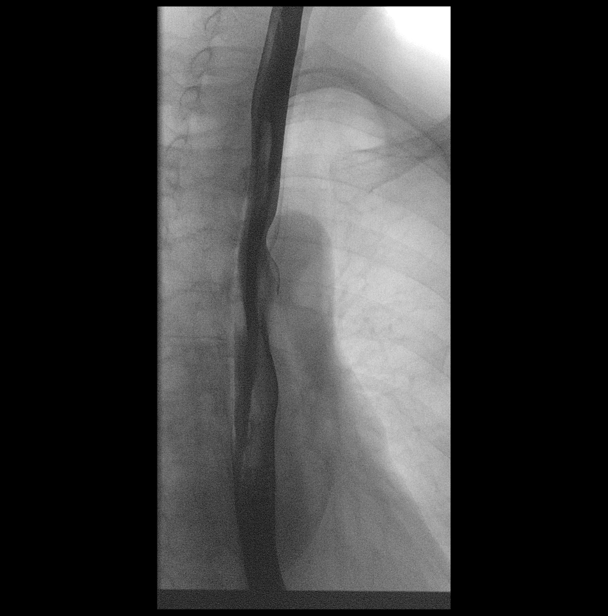
[frame 46/54]
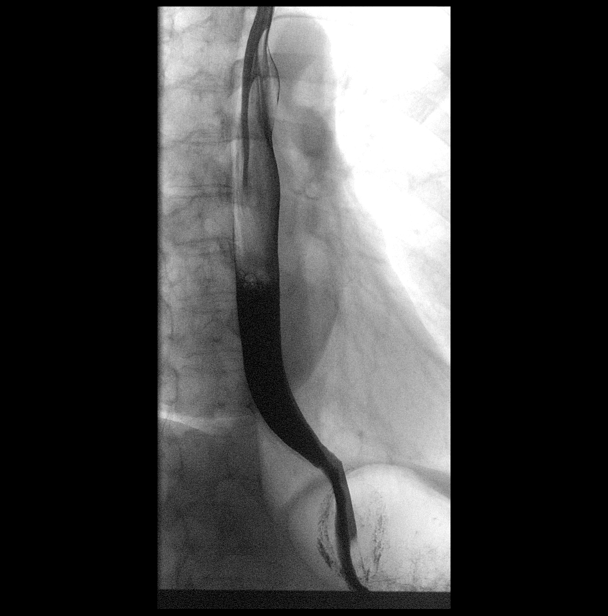

[Series 7: cp_standard · 0.25mm/px · 3 of 10 frames shown (6 of 8)]
[frame 2/10]
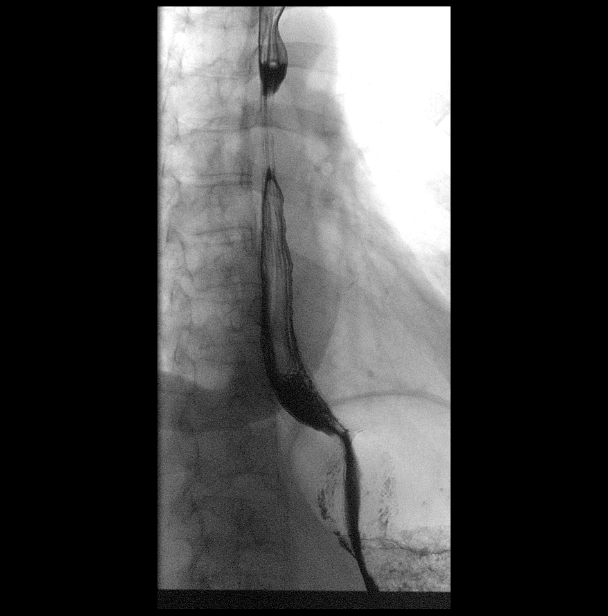
[frame 4/10]
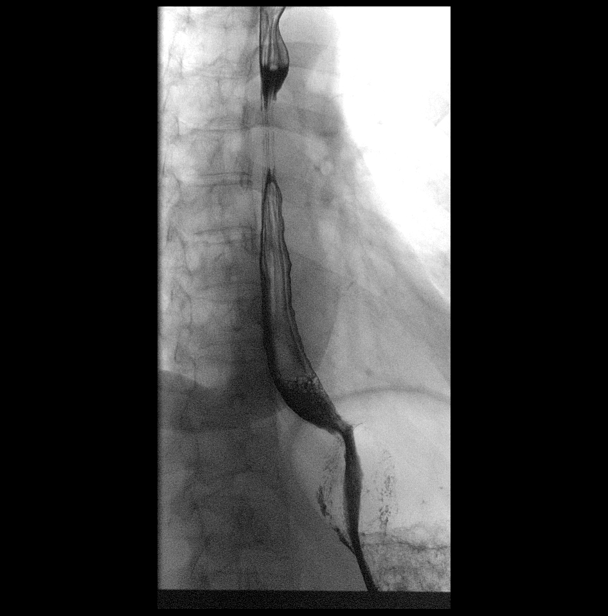
[frame 9/10]
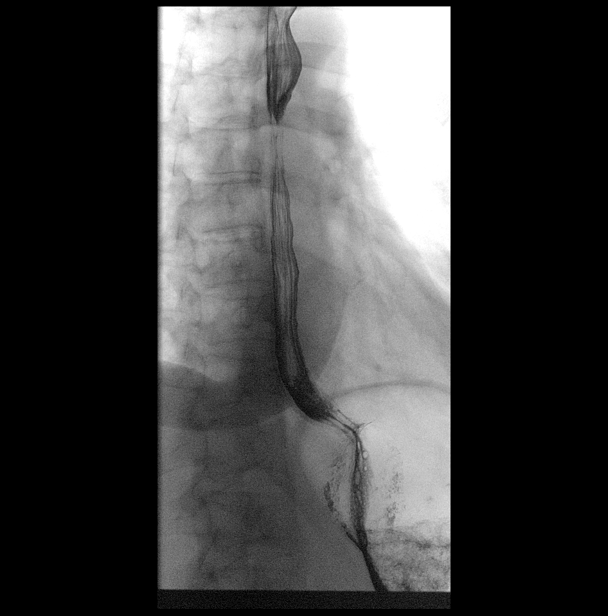

[Series 8: cp_standard · 0.28mm/px · 1 of 1 slices shown (7 of 8)]
[im 1/1]
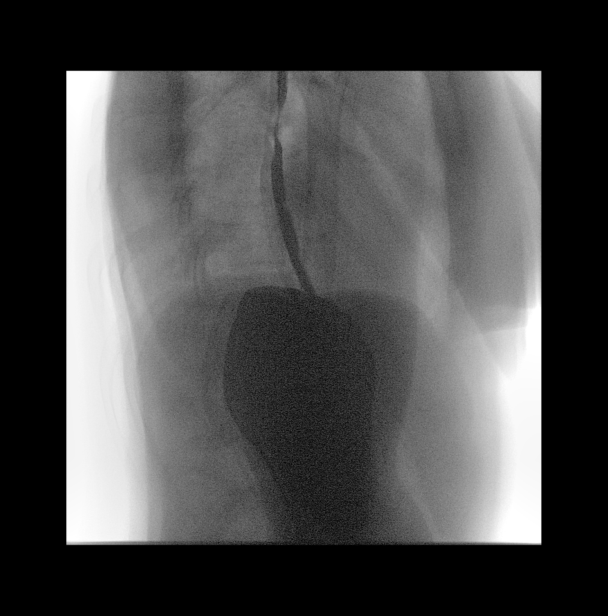

[Series 10: cp_standard · 0.28mm/px · 1 of 1 slices shown (8 of 8)]
[im 1/1]
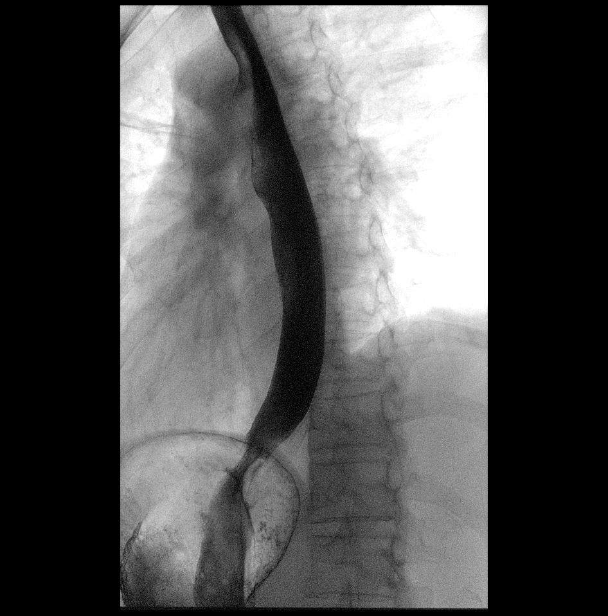

[14 of 21 positions shown; findings below may reference images not displayed]

FINDINGS: Normal pharyngeal anatomy and motility. Contrast flowed freely
through the esophagus without evidence of a stricture or mass.
Normal esophageal mucosa without evidence of irregularity or
ulceration. Esophageal motility was normal. Moderate severity
gastroesophageal reflux. No definite hiatal hernia was demonstrated.

At the end of the examination a 13 mm barium tablet was administered
which transited through the esophagus and esophagogastric junction
without delay.
IMPRESSION: 1. No esophageal stricture.  No esophageal dysmotility.
2. Moderate severity gastroesophageal reflux.

## 2020-08-29 ENCOUNTER — Other Ambulatory Visit: Payer: Self-pay | Admitting: Internal Medicine

## 2020-09-25 ENCOUNTER — Other Ambulatory Visit: Payer: Self-pay | Admitting: Internal Medicine

## 2020-11-13 ENCOUNTER — Other Ambulatory Visit: Payer: Self-pay | Admitting: Internal Medicine

## 2021-01-14 ENCOUNTER — Other Ambulatory Visit: Payer: Self-pay

## 2021-01-14 MED FILL — Metoprolol Succinate Tab ER 24HR 25 MG (Tartrate Equiv): ORAL | 90 days supply | Qty: 90 | Fill #0 | Status: AC

## 2021-01-14 MED FILL — Lisinopril Tab 10 MG: ORAL | 90 days supply | Qty: 90 | Fill #0 | Status: AC

## 2021-01-14 MED FILL — Alprazolam Tab 0.25 MG: ORAL | 30 days supply | Qty: 30 | Fill #0 | Status: AC

## 2021-01-14 MED FILL — Levothyroxine Sodium Tab 100 MCG: ORAL | 90 days supply | Qty: 90 | Fill #0 | Status: AC

## 2021-01-14 MED FILL — Sumatriptan Succinate Tab 100 MG: ORAL | 30 days supply | Qty: 9 | Fill #0 | Status: AC

## 2021-02-10 MED FILL — Alprazolam Tab 0.25 MG: ORAL | 30 days supply | Qty: 30 | Fill #1 | Status: AC

## 2021-02-10 MED FILL — Sumatriptan Succinate Tab 100 MG: ORAL | 30 days supply | Qty: 9 | Fill #1 | Status: AC

## 2021-02-11 ENCOUNTER — Other Ambulatory Visit: Payer: Self-pay

## 2021-03-13 ENCOUNTER — Other Ambulatory Visit: Payer: Self-pay

## 2021-03-13 MED FILL — Alprazolam Tab 0.25 MG: ORAL | 30 days supply | Qty: 30 | Fill #2 | Status: AC

## 2021-03-19 ENCOUNTER — Other Ambulatory Visit: Payer: Self-pay

## 2021-03-19 MED FILL — Sumatriptan Succinate Tab 100 MG: ORAL | 30 days supply | Qty: 9 | Fill #2 | Status: AC

## 2021-04-15 ENCOUNTER — Other Ambulatory Visit (HOSPITAL_COMMUNITY): Payer: Self-pay

## 2021-04-17 ENCOUNTER — Other Ambulatory Visit: Payer: Self-pay

## 2021-04-17 MED FILL — Levothyroxine Sodium Tab 100 MCG: ORAL | 90 days supply | Qty: 90 | Fill #1 | Status: AC

## 2021-04-17 MED FILL — Metoprolol Succinate Tab ER 24HR 25 MG (Tartrate Equiv): ORAL | 90 days supply | Qty: 90 | Fill #1 | Status: AC

## 2021-04-18 ENCOUNTER — Other Ambulatory Visit: Payer: Self-pay

## 2021-04-18 MED ORDER — ALPRAZOLAM 0.25 MG PO TABS
0.2500 mg | ORAL_TABLET | Freq: Every day | ORAL | 5 refills | Status: DC | PRN
  Filled 2021-04-18: qty 30, 30d supply, fill #0
  Filled 2021-05-23: qty 30, 30d supply, fill #1
  Filled 2021-06-19 – 2021-06-20 (×2): qty 30, 30d supply, fill #2
  Filled 2021-07-14: qty 30, 30d supply, fill #3
  Filled 2021-08-11: qty 30, 30d supply, fill #4
  Filled 2021-09-09: qty 30, 30d supply, fill #5

## 2021-04-18 MED ORDER — SUMATRIPTAN SUCCINATE 100 MG PO TABS
ORAL_TABLET | ORAL | 6 refills | Status: DC
  Filled 2021-04-18: qty 9, 15d supply, fill #0
  Filled 2021-05-01: qty 9, 15d supply, fill #1
  Filled 2021-05-23: qty 9, 15d supply, fill #2
  Filled 2021-06-19: qty 9, 15d supply, fill #3
  Filled 2021-09-09: qty 9, 15d supply, fill #4
  Filled 2021-10-10: qty 9, 15d supply, fill #5
  Filled 2021-11-10: qty 9, 15d supply, fill #6

## 2021-05-01 ENCOUNTER — Other Ambulatory Visit: Payer: Self-pay

## 2021-05-01 MED ORDER — SUMATRIPTAN SUCCINATE 100 MG PO TABS
ORAL_TABLET | ORAL | 6 refills | Status: DC
  Filled 2021-05-01: qty 9, 30d supply, fill #0
  Filled 2021-06-19: qty 9, 10d supply, fill #0
  Filled 2021-07-14: qty 9, 30d supply, fill #1
  Filled 2021-07-17: qty 9, 30d supply, fill #2
  Filled 2021-08-11: qty 9, 30d supply, fill #3
  Filled 2021-08-12: qty 9, 30d supply, fill #4
  Filled 2021-09-09: qty 9, 30d supply, fill #5
  Filled 2021-10-10: qty 9, 30d supply, fill #6
  Filled 2021-11-10: qty 9, 30d supply, fill #7
  Filled 2021-11-13: qty 9, 15d supply, fill #7
  Filled 2021-12-12: qty 9, 15d supply, fill #8
  Filled 2022-01-15: qty 9, 15d supply, fill #9
  Filled 2022-02-12: qty 9, 15d supply, fill #10
  Filled 2022-03-12: qty 9, 15d supply, fill #11
  Filled 2022-04-04: qty 9, 15d supply, fill #12

## 2021-05-23 ENCOUNTER — Other Ambulatory Visit: Payer: Self-pay

## 2021-06-19 ENCOUNTER — Other Ambulatory Visit: Payer: Self-pay

## 2021-06-20 ENCOUNTER — Other Ambulatory Visit: Payer: Self-pay

## 2021-07-05 ENCOUNTER — Other Ambulatory Visit: Payer: Self-pay

## 2021-07-14 MED FILL — Levothyroxine Sodium Tab 100 MCG: ORAL | 90 days supply | Qty: 90 | Fill #2 | Status: AC

## 2021-07-14 MED FILL — Metoprolol Succinate Tab ER 24HR 25 MG (Tartrate Equiv): ORAL | 90 days supply | Qty: 90 | Fill #2 | Status: AC

## 2021-07-15 ENCOUNTER — Other Ambulatory Visit: Payer: Self-pay

## 2021-07-17 ENCOUNTER — Other Ambulatory Visit: Payer: Self-pay

## 2021-08-12 ENCOUNTER — Other Ambulatory Visit: Payer: Self-pay

## 2021-09-09 ENCOUNTER — Other Ambulatory Visit: Payer: Self-pay

## 2021-10-10 ENCOUNTER — Other Ambulatory Visit: Payer: Self-pay

## 2021-10-10 MED ORDER — ALPRAZOLAM 0.25 MG PO TABS
0.2500 mg | ORAL_TABLET | Freq: Every day | ORAL | 5 refills | Status: DC | PRN
  Filled 2021-10-10: qty 30, 30d supply, fill #0
  Filled 2021-11-10: qty 30, 30d supply, fill #1
  Filled 2021-12-12: qty 30, 30d supply, fill #2
  Filled 2022-01-15: qty 30, 30d supply, fill #3
  Filled 2022-02-12: qty 30, 30d supply, fill #4
  Filled 2022-03-12: qty 30, 30d supply, fill #5

## 2021-10-10 MED FILL — Metoprolol Succinate Tab ER 24HR 25 MG (Tartrate Equiv): ORAL | 90 days supply | Qty: 90 | Fill #3 | Status: AC

## 2021-10-11 ENCOUNTER — Other Ambulatory Visit: Payer: Self-pay

## 2021-10-11 MED ORDER — SYNTHROID 100 MCG PO TABS
ORAL_TABLET | ORAL | 0 refills | Status: DC
  Filled 2021-10-11: qty 90, 90d supply, fill #0

## 2021-11-11 ENCOUNTER — Other Ambulatory Visit: Payer: Self-pay

## 2021-11-13 ENCOUNTER — Other Ambulatory Visit: Payer: Self-pay

## 2021-12-12 ENCOUNTER — Other Ambulatory Visit: Payer: Self-pay

## 2021-12-12 MED ORDER — SUMATRIPTAN SUCCINATE 100 MG PO TABS
ORAL_TABLET | ORAL | 6 refills | Status: AC
  Filled 2021-12-12: qty 9, 30d supply, fill #0

## 2021-12-24 ENCOUNTER — Other Ambulatory Visit: Payer: Self-pay

## 2021-12-24 MED ORDER — CLOBETASOL PROPIONATE 0.05 % EX SOLN
CUTANEOUS | 3 refills | Status: AC
  Filled 2021-12-24: qty 50, 30d supply, fill #0

## 2021-12-24 MED ORDER — TRIAMCINOLONE ACETONIDE 0.1 % EX CREA
TOPICAL_CREAM | CUTANEOUS | 0 refills | Status: AC
  Filled 2021-12-24: qty 80, 20d supply, fill #0

## 2021-12-25 ENCOUNTER — Other Ambulatory Visit: Payer: Self-pay

## 2021-12-25 MED ORDER — SUMATRIPTAN SUCCINATE 100 MG PO TABS
ORAL_TABLET | ORAL | 6 refills | Status: AC
  Filled 2021-12-25: qty 9, 9d supply, fill #0
  Filled 2022-01-15: qty 9, 9d supply, fill #1
  Filled 2022-02-12: qty 9, 9d supply, fill #2
  Filled 2022-03-12: qty 9, 15d supply, fill #3
  Filled 2022-04-04: qty 9, 15d supply, fill #4
  Filled 2022-05-05: qty 9, 15d supply, fill #5

## 2021-12-26 ENCOUNTER — Other Ambulatory Visit: Payer: Self-pay

## 2022-01-15 ENCOUNTER — Other Ambulatory Visit: Payer: Self-pay

## 2022-01-29 ENCOUNTER — Other Ambulatory Visit: Payer: Self-pay

## 2022-01-29 MED ORDER — METOPROLOL SUCCINATE ER 25 MG PO TB24
25.0000 mg | ORAL_TABLET | Freq: Every day | ORAL | 4 refills | Status: AC
  Filled 2022-01-29: qty 90, 90d supply, fill #0
  Filled 2022-05-05: qty 90, 90d supply, fill #1
  Filled 2022-08-05: qty 90, 90d supply, fill #2
  Filled 2022-11-07: qty 90, 90d supply, fill #3

## 2022-01-29 MED ORDER — SYNTHROID 100 MCG PO TABS
ORAL_TABLET | ORAL | 0 refills | Status: DC
  Filled 2022-01-29: qty 90, 90d supply, fill #0

## 2022-01-31 ENCOUNTER — Other Ambulatory Visit: Payer: Self-pay

## 2022-02-12 ENCOUNTER — Other Ambulatory Visit: Payer: Self-pay

## 2022-03-12 ENCOUNTER — Other Ambulatory Visit: Payer: Self-pay

## 2022-04-04 ENCOUNTER — Other Ambulatory Visit: Payer: Self-pay

## 2022-04-04 MED ORDER — ALPRAZOLAM 0.25 MG PO TABS
0.2500 mg | ORAL_TABLET | Freq: Every day | ORAL | 5 refills | Status: DC | PRN
  Filled 2022-04-09: qty 30, 30d supply, fill #0
  Filled 2022-05-05: qty 30, 30d supply, fill #1
  Filled 2022-06-10: qty 30, 30d supply, fill #2
  Filled 2022-07-08: qty 30, 30d supply, fill #3
  Filled 2022-08-05: qty 30, 30d supply, fill #4
  Filled 2022-09-05: qty 30, 30d supply, fill #5

## 2022-04-09 ENCOUNTER — Other Ambulatory Visit: Payer: Self-pay

## 2022-05-05 ENCOUNTER — Other Ambulatory Visit: Payer: Self-pay

## 2022-05-05 MED ORDER — SYNTHROID 100 MCG PO TABS
ORAL_TABLET | ORAL | 0 refills | Status: DC
  Filled 2022-05-05: qty 90, 90d supply, fill #0

## 2022-05-05 MED ORDER — SUMATRIPTAN SUCCINATE 100 MG PO TABS
ORAL_TABLET | ORAL | 6 refills | Status: DC
  Filled 2022-05-05: qty 18, 30d supply, fill #0
  Filled 2022-06-02: qty 18, 30d supply, fill #1
  Filled 2022-07-08: qty 18, 30d supply, fill #2
  Filled 2022-08-05: qty 18, 30d supply, fill #3
  Filled 2022-09-05: qty 18, 30d supply, fill #4
  Filled 2022-10-08: qty 18, 30d supply, fill #5
  Filled 2022-11-07: qty 18, 30d supply, fill #6
  Filled 2022-12-05: qty 14, 50d supply, fill #7

## 2022-05-07 ENCOUNTER — Other Ambulatory Visit: Payer: Self-pay

## 2022-05-08 ENCOUNTER — Other Ambulatory Visit: Payer: Self-pay

## 2022-06-02 ENCOUNTER — Other Ambulatory Visit: Payer: Self-pay

## 2022-06-10 ENCOUNTER — Other Ambulatory Visit: Payer: Self-pay

## 2022-07-08 ENCOUNTER — Other Ambulatory Visit: Payer: Self-pay

## 2022-08-05 ENCOUNTER — Other Ambulatory Visit: Payer: Self-pay

## 2022-08-05 MED ORDER — SYNTHROID 100 MCG PO TABS
ORAL_TABLET | ORAL | 0 refills | Status: DC
  Filled 2022-08-05: qty 90, 90d supply, fill #0

## 2022-08-06 ENCOUNTER — Other Ambulatory Visit: Payer: Self-pay

## 2022-09-08 ENCOUNTER — Other Ambulatory Visit: Payer: Self-pay

## 2022-10-08 ENCOUNTER — Other Ambulatory Visit: Payer: Self-pay

## 2022-10-08 MED ORDER — ALPRAZOLAM 0.25 MG PO TABS
0.2500 mg | ORAL_TABLET | Freq: Every day | ORAL | 5 refills | Status: DC | PRN
  Filled 2022-10-08: qty 30, 30d supply, fill #0
  Filled 2022-11-07: qty 30, 30d supply, fill #1
  Filled 2022-12-05: qty 30, 30d supply, fill #2
  Filled 2022-12-31: qty 30, 30d supply, fill #3
  Filled 2023-03-06: qty 30, 30d supply, fill #4

## 2022-11-07 ENCOUNTER — Other Ambulatory Visit: Payer: Self-pay

## 2022-11-07 MED ORDER — SYNTHROID 100 MCG PO TABS
100.0000 ug | ORAL_TABLET | ORAL | 0 refills | Status: DC
  Filled 2022-11-07: qty 90, 90d supply, fill #0

## 2022-12-05 ENCOUNTER — Other Ambulatory Visit: Payer: Self-pay

## 2022-12-31 ENCOUNTER — Other Ambulatory Visit: Payer: Self-pay

## 2023-01-01 ENCOUNTER — Other Ambulatory Visit: Payer: Self-pay

## 2023-01-06 ENCOUNTER — Other Ambulatory Visit: Payer: Self-pay

## 2023-01-06 MED ORDER — SUMATRIPTAN SUCCINATE 100 MG PO TABS
100.0000 mg | ORAL_TABLET | Freq: Once | ORAL | 1 refills | Status: AC
  Filled 2023-01-06: qty 9, 30d supply, fill #0
  Filled 2023-02-04: qty 9, 30d supply, fill #1
  Filled 2023-03-06: qty 9, 30d supply, fill #2
  Filled 2023-04-01: qty 9, 30d supply, fill #3

## 2023-01-07 ENCOUNTER — Other Ambulatory Visit: Payer: Self-pay

## 2023-01-12 ENCOUNTER — Other Ambulatory Visit: Payer: Self-pay

## 2023-02-05 ENCOUNTER — Other Ambulatory Visit: Payer: Self-pay

## 2023-02-22 ENCOUNTER — Other Ambulatory Visit: Payer: Self-pay

## 2023-02-23 ENCOUNTER — Other Ambulatory Visit: Payer: Self-pay

## 2023-02-23 MED ORDER — METOPROLOL SUCCINATE ER 25 MG PO TB24
25.0000 mg | ORAL_TABLET | Freq: Every day | ORAL | 0 refills | Status: DC
  Filled 2023-02-23: qty 90, 90d supply, fill #0

## 2023-03-06 ENCOUNTER — Other Ambulatory Visit: Payer: Self-pay

## 2023-03-25 ENCOUNTER — Other Ambulatory Visit: Payer: Self-pay

## 2023-03-25 MED ORDER — CLOBETASOL PROPIONATE 0.05 % EX OINT
1.0000 | TOPICAL_OINTMENT | Freq: Two times a day (BID) | CUTANEOUS | 1 refills | Status: AC
  Filled 2023-03-25: qty 30, 15d supply, fill #0

## 2023-03-27 ENCOUNTER — Other Ambulatory Visit: Payer: Self-pay

## 2023-03-30 ENCOUNTER — Other Ambulatory Visit: Payer: Self-pay

## 2023-03-30 MED ORDER — ESTRADIOL 0.025 MG/24HR TD PTTW
1.0000 | MEDICATED_PATCH | TRANSDERMAL | 0 refills | Status: AC
  Filled 2023-03-30: qty 24, 84d supply, fill #0

## 2023-03-31 ENCOUNTER — Other Ambulatory Visit: Payer: Self-pay

## 2023-03-31 MED ORDER — SYNTHROID 100 MCG PO TABS
100.0000 ug | ORAL_TABLET | Freq: Every morning | ORAL | 0 refills | Status: DC
  Filled 2023-03-31: qty 90, 90d supply, fill #0

## 2023-04-01 ENCOUNTER — Other Ambulatory Visit: Payer: Self-pay

## 2023-05-08 ENCOUNTER — Other Ambulatory Visit: Payer: Self-pay

## 2023-05-08 MED ORDER — ALPRAZOLAM 0.25 MG PO TABS
0.2500 mg | ORAL_TABLET | Freq: Every day | ORAL | 5 refills | Status: DC | PRN
  Filled 2023-05-08: qty 30, 30d supply, fill #0
  Filled 2023-06-15: qty 30, 30d supply, fill #1
  Filled 2023-08-09: qty 30, 30d supply, fill #2
  Filled 2023-09-20: qty 30, 30d supply, fill #3
  Filled 2023-10-23: qty 30, 30d supply, fill #4

## 2023-05-08 MED ORDER — METOPROLOL SUCCINATE ER 25 MG PO TB24
25.0000 mg | ORAL_TABLET | Freq: Every day | ORAL | 0 refills | Status: DC
  Filled 2023-05-08: qty 90, 90d supply, fill #0

## 2023-05-08 MED ORDER — SYNTHROID 100 MCG PO TABS
100.0000 ug | ORAL_TABLET | Freq: Every day | ORAL | 0 refills | Status: DC
  Filled 2023-05-08 – 2023-06-24 (×2): qty 90, 90d supply, fill #0

## 2023-05-08 MED ORDER — SUMATRIPTAN SUCCINATE 100 MG PO TABS
100.0000 mg | ORAL_TABLET | ORAL | 11 refills | Status: DC
  Filled 2023-05-08: qty 9, 30d supply, fill #0
  Filled 2023-06-15: qty 9, 30d supply, fill #1
  Filled 2023-07-13: qty 9, 30d supply, fill #2
  Filled 2023-08-20: qty 9, 30d supply, fill #3
  Filled 2023-09-21: qty 9, 30d supply, fill #4
  Filled 2023-10-23 – 2023-10-27 (×2): qty 9, 30d supply, fill #5
  Filled 2023-11-24 – 2023-11-25 (×2): qty 9, 30d supply, fill #6
  Filled 2023-12-20: qty 9, 30d supply, fill #7
  Filled 2024-01-22: qty 9, 30d supply, fill #8
  Filled 2024-02-21: qty 9, 30d supply, fill #9
  Filled 2024-03-22: qty 9, 30d supply, fill #10
  Filled 2024-04-23 – 2024-04-25 (×2): qty 9, 30d supply, fill #11

## 2023-05-13 ENCOUNTER — Other Ambulatory Visit: Payer: Self-pay | Admitting: Internal Medicine

## 2023-05-13 ENCOUNTER — Other Ambulatory Visit: Payer: Self-pay

## 2023-05-13 DIAGNOSIS — E7849 Other hyperlipidemia: Secondary | ICD-10-CM

## 2023-05-13 DIAGNOSIS — Z0000 Encounter for general adult medical examination without abnormal findings: Secondary | ICD-10-CM

## 2023-06-02 ENCOUNTER — Other Ambulatory Visit: Payer: BC Managed Care – PPO

## 2023-06-16 ENCOUNTER — Other Ambulatory Visit: Payer: Self-pay

## 2023-06-24 ENCOUNTER — Other Ambulatory Visit: Payer: Self-pay

## 2023-07-13 ENCOUNTER — Other Ambulatory Visit: Payer: Self-pay

## 2023-07-22 ENCOUNTER — Other Ambulatory Visit: Payer: Self-pay

## 2023-07-22 MED ORDER — METOPROLOL SUCCINATE ER 25 MG PO TB24
25.0000 mg | ORAL_TABLET | Freq: Every day | ORAL | 0 refills | Status: DC
  Filled 2023-07-22 – 2023-08-05 (×2): qty 90, 90d supply, fill #0

## 2023-07-24 ENCOUNTER — Other Ambulatory Visit: Payer: Self-pay

## 2023-08-04 ENCOUNTER — Other Ambulatory Visit: Payer: Self-pay

## 2023-08-04 MED ORDER — SUMATRIPTAN SUCCINATE 100 MG PO TABS
100.0000 mg | ORAL_TABLET | ORAL | 11 refills | Status: AC
  Filled 2023-08-04: qty 9, 15d supply, fill #0
  Filled 2023-09-04: qty 9, 30d supply, fill #1
  Filled 2023-09-21: qty 9, 30d supply, fill #2
  Filled 2023-10-23: qty 9, 30d supply, fill #3
  Filled 2023-11-24: qty 9, 30d supply, fill #4
  Filled 2023-12-20: qty 9, 30d supply, fill #5
  Filled 2024-01-22 – 2024-01-25 (×2): qty 9, 30d supply, fill #6
  Filled 2024-02-21 – 2024-02-22 (×2): qty 9, 30d supply, fill #7
  Filled 2024-03-22: qty 9, 30d supply, fill #8
  Filled 2024-04-23: qty 9, 30d supply, fill #9
  Filled 2024-05-22: qty 9, 30d supply, fill #10
  Filled 2024-06-30: qty 9, 30d supply, fill #11

## 2023-08-05 ENCOUNTER — Other Ambulatory Visit: Payer: Self-pay

## 2023-08-10 ENCOUNTER — Other Ambulatory Visit: Payer: Self-pay

## 2023-09-04 ENCOUNTER — Other Ambulatory Visit: Payer: Self-pay

## 2023-09-20 ENCOUNTER — Other Ambulatory Visit: Payer: Self-pay

## 2023-09-21 ENCOUNTER — Other Ambulatory Visit: Payer: Self-pay

## 2023-09-21 MED ORDER — SYNTHROID 100 MCG PO TABS
100.0000 ug | ORAL_TABLET | Freq: Every day | ORAL | 1 refills | Status: AC
  Filled 2023-09-21: qty 90, 90d supply, fill #0
  Filled 2023-12-17: qty 90, 90d supply, fill #1

## 2023-10-08 ENCOUNTER — Other Ambulatory Visit: Payer: Self-pay

## 2023-10-23 ENCOUNTER — Other Ambulatory Visit: Payer: Self-pay

## 2023-10-27 ENCOUNTER — Other Ambulatory Visit: Payer: Self-pay

## 2023-11-24 ENCOUNTER — Other Ambulatory Visit: Payer: Self-pay

## 2023-11-24 MED ORDER — METOPROLOL SUCCINATE ER 25 MG PO TB24
25.0000 mg | ORAL_TABLET | Freq: Every day | ORAL | 0 refills | Status: DC
  Filled 2023-11-24: qty 90, 90d supply, fill #0

## 2023-11-24 MED ORDER — ALPRAZOLAM 0.25 MG PO TABS
0.2500 mg | ORAL_TABLET | Freq: Every day | ORAL | 5 refills | Status: DC | PRN
  Filled 2023-11-24: qty 30, 30d supply, fill #0
  Filled 2023-12-20 – 2023-12-23 (×2): qty 30, 30d supply, fill #1
  Filled 2024-01-22: qty 30, 30d supply, fill #2
  Filled 2024-02-23: qty 30, 30d supply, fill #3
  Filled 2024-03-22: qty 30, 30d supply, fill #4
  Filled 2024-04-23: qty 30, 30d supply, fill #5

## 2023-11-25 ENCOUNTER — Other Ambulatory Visit: Payer: Self-pay

## 2023-12-17 ENCOUNTER — Other Ambulatory Visit: Payer: Self-pay

## 2023-12-20 ENCOUNTER — Other Ambulatory Visit: Payer: Self-pay

## 2023-12-21 ENCOUNTER — Other Ambulatory Visit: Payer: Self-pay

## 2023-12-22 ENCOUNTER — Other Ambulatory Visit: Payer: Self-pay

## 2024-01-22 ENCOUNTER — Other Ambulatory Visit: Payer: Self-pay

## 2024-01-25 ENCOUNTER — Other Ambulatory Visit: Payer: Self-pay

## 2024-02-21 ENCOUNTER — Other Ambulatory Visit: Payer: Self-pay

## 2024-02-22 ENCOUNTER — Other Ambulatory Visit: Payer: Self-pay

## 2024-02-22 MED ORDER — METOPROLOL SUCCINATE ER 25 MG PO TB24
25.0000 mg | ORAL_TABLET | Freq: Every day | ORAL | 0 refills | Status: DC
Start: 2024-02-22 — End: 2024-05-22
  Filled 2024-02-22: qty 90, 90d supply, fill #0

## 2024-02-23 ENCOUNTER — Other Ambulatory Visit: Payer: Self-pay

## 2024-03-22 ENCOUNTER — Other Ambulatory Visit: Payer: Self-pay

## 2024-03-23 ENCOUNTER — Other Ambulatory Visit: Payer: Self-pay

## 2024-03-23 MED ORDER — SYNTHROID 100 MCG PO TABS
100.0000 ug | ORAL_TABLET | Freq: Every day | ORAL | 1 refills | Status: AC
  Filled 2024-03-23: qty 90, 90d supply, fill #0
  Filled 2024-06-16: qty 90, 90d supply, fill #1

## 2024-04-24 ENCOUNTER — Other Ambulatory Visit: Payer: Self-pay

## 2024-04-25 ENCOUNTER — Other Ambulatory Visit: Payer: Self-pay

## 2024-05-22 ENCOUNTER — Other Ambulatory Visit: Payer: Self-pay

## 2024-05-22 MED ORDER — SUMATRIPTAN SUCCINATE 100 MG PO TABS
100.0000 mg | ORAL_TABLET | ORAL | 11 refills | Status: AC
  Filled 2024-05-22: qty 9, 30d supply, fill #0
  Filled 2024-06-30 (×2): qty 9, 30d supply, fill #1
  Filled 2024-07-28: qty 9, 30d supply, fill #2
  Filled 2024-08-08: qty 9, 30d supply, fill #3
  Filled 2024-09-07: qty 9, 30d supply, fill #4
  Filled 2024-10-23 – 2024-10-26 (×2): qty 9, 30d supply, fill #5

## 2024-05-22 MED ORDER — METOPROLOL SUCCINATE ER 25 MG PO TB24
25.0000 mg | ORAL_TABLET | Freq: Every day | ORAL | 1 refills | Status: AC
  Filled 2024-05-22: qty 90, 90d supply, fill #0
  Filled 2024-08-17: qty 90, 90d supply, fill #1

## 2024-05-23 ENCOUNTER — Other Ambulatory Visit: Payer: Self-pay

## 2024-06-16 ENCOUNTER — Other Ambulatory Visit: Payer: Self-pay

## 2024-06-28 ENCOUNTER — Other Ambulatory Visit: Payer: Self-pay | Admitting: Internal Medicine

## 2024-06-28 DIAGNOSIS — Z1231 Encounter for screening mammogram for malignant neoplasm of breast: Secondary | ICD-10-CM

## 2024-06-29 ENCOUNTER — Other Ambulatory Visit: Payer: Self-pay | Admitting: Internal Medicine

## 2024-06-29 DIAGNOSIS — Z0000 Encounter for general adult medical examination without abnormal findings: Secondary | ICD-10-CM

## 2024-06-29 DIAGNOSIS — E7849 Other hyperlipidemia: Secondary | ICD-10-CM

## 2024-06-30 ENCOUNTER — Other Ambulatory Visit: Payer: Self-pay

## 2024-06-30 MED ORDER — ALPRAZOLAM 0.25 MG PO TABS
0.2500 mg | ORAL_TABLET | Freq: Every day | ORAL | 5 refills | Status: AC | PRN
  Filled 2024-06-30: qty 30, 30d supply, fill #0
  Filled 2024-07-28: qty 30, 30d supply, fill #1
  Filled 2024-08-28: qty 30, 30d supply, fill #2
  Filled 2024-10-23: qty 30, 30d supply, fill #3

## 2024-07-28 ENCOUNTER — Other Ambulatory Visit: Payer: Self-pay

## 2024-08-07 ENCOUNTER — Other Ambulatory Visit: Payer: Self-pay

## 2024-08-08 ENCOUNTER — Other Ambulatory Visit: Payer: Self-pay

## 2024-08-28 ENCOUNTER — Other Ambulatory Visit: Payer: Self-pay

## 2024-08-29 ENCOUNTER — Other Ambulatory Visit: Payer: Self-pay

## 2024-08-30 ENCOUNTER — Other Ambulatory Visit: Payer: Self-pay

## 2024-08-31 ENCOUNTER — Other Ambulatory Visit: Payer: Self-pay

## 2024-09-02 ENCOUNTER — Other Ambulatory Visit: Payer: Self-pay

## 2024-09-04 ENCOUNTER — Other Ambulatory Visit: Payer: Self-pay

## 2024-09-05 ENCOUNTER — Other Ambulatory Visit: Payer: Self-pay

## 2024-09-08 ENCOUNTER — Other Ambulatory Visit: Payer: Self-pay

## 2024-09-08 MED ORDER — SUMATRIPTAN SUCCINATE 100 MG PO TABS
100.0000 mg | ORAL_TABLET | ORAL | 11 refills | Status: AC
  Filled 2024-09-08 – 2024-09-13 (×2): qty 9, 30d supply, fill #0
  Filled 2024-10-23: qty 9, 30d supply, fill #1

## 2024-09-13 ENCOUNTER — Other Ambulatory Visit: Payer: Self-pay

## 2024-09-19 ENCOUNTER — Other Ambulatory Visit: Payer: Self-pay

## 2024-09-21 ENCOUNTER — Other Ambulatory Visit: Payer: Self-pay

## 2024-09-21 MED ORDER — SYNTHROID 100 MCG PO TABS
100.0000 ug | ORAL_TABLET | Freq: Every day | ORAL | 1 refills | Status: AC
  Filled 2024-09-21: qty 90, 90d supply, fill #0

## 2024-10-23 ENCOUNTER — Other Ambulatory Visit: Payer: Self-pay

## 2024-10-26 ENCOUNTER — Other Ambulatory Visit: Payer: Self-pay
